# Patient Record
Sex: Male | Born: 1990 | Race: White | Hispanic: No | Marital: Single | Smoking: Current every day smoker
Health system: Southern US, Community
[De-identification: ages and names within clinical notes are randomized; demographics above are authoritative.]

## PROBLEM LIST (undated history)

## (undated) DIAGNOSIS — L503 Dermatographic urticaria: Secondary | ICD-10-CM

## (undated) HISTORY — PX: KNEE SURGERY: SHX244

---
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
## 2009-03-11 ENCOUNTER — Emergency Department: Payer: Self-pay | Admitting: Unknown Physician Specialty

## 2011-04-22 ENCOUNTER — Emergency Department: Payer: Self-pay | Admitting: Emergency Medicine

## 2013-11-21 ENCOUNTER — Emergency Department: Payer: Self-pay | Admitting: Emergency Medicine

## 2014-04-28 ENCOUNTER — Emergency Department: Payer: Self-pay | Admitting: Emergency Medicine

## 2014-05-11 ENCOUNTER — Emergency Department: Payer: Self-pay | Admitting: Emergency Medicine

## 2014-10-21 ENCOUNTER — Emergency Department: Payer: Self-pay | Admitting: Emergency Medicine

## 2016-04-22 ENCOUNTER — Emergency Department: Payer: Self-pay

## 2016-04-22 ENCOUNTER — Encounter: Payer: Self-pay | Admitting: Emergency Medicine

## 2016-04-22 ENCOUNTER — Emergency Department
Admission: EM | Admit: 2016-04-22 | Discharge: 2016-04-22 | Disposition: A | Discharge status: Home / Self Care | Payer: Self-pay | Attending: Emergency Medicine | Admitting: Emergency Medicine

## 2016-04-22 DIAGNOSIS — R21 Rash and other nonspecific skin eruption: Secondary | ICD-10-CM | POA: Insufficient documentation

## 2016-04-22 DIAGNOSIS — R079 Chest pain, unspecified: Secondary | ICD-10-CM | POA: Insufficient documentation

## 2016-04-22 DIAGNOSIS — Z5321 Procedure and treatment not carried out due to patient leaving prior to being seen by health care provider: Secondary | ICD-10-CM | POA: Insufficient documentation

## 2016-04-22 DIAGNOSIS — F17210 Nicotine dependence, cigarettes, uncomplicated: Secondary | ICD-10-CM | POA: Insufficient documentation

## 2016-04-22 LAB — BASIC METABOLIC PANEL
ANION GAP: 5 (ref 5–15)
BUN: 19 mg/dL (ref 6–20)
CALCIUM: 9.2 mg/dL (ref 8.9–10.3)
CO2: 27 mmol/L (ref 22–32)
Chloride: 108 mmol/L (ref 101–111)
Creatinine, Ser: 0.93 mg/dL (ref 0.61–1.24)
GLUCOSE: 108 mg/dL — AB (ref 65–99)
POTASSIUM: 3.8 mmol/L (ref 3.5–5.1)
Sodium: 140 mmol/L (ref 135–145)

## 2016-04-22 LAB — CBC
HEMATOCRIT: 41 % (ref 40.0–52.0)
Hemoglobin: 14.1 g/dL (ref 13.0–18.0)
MCH: 31.2 pg (ref 26.0–34.0)
MCHC: 34.5 g/dL (ref 32.0–36.0)
MCV: 90.4 fL (ref 80.0–100.0)
PLATELETS: 227 10*3/uL (ref 150–440)
RBC: 4.53 MIL/uL (ref 4.40–5.90)
RDW: 12.8 % (ref 11.5–14.5)
WBC: 8.3 10*3/uL (ref 3.8–10.6)

## 2016-04-22 LAB — TROPONIN I: Troponin I: <0.03 ng/mL (ref <0.03)

## 2016-04-22 NOTE — ED Notes (Signed)
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

## 2016-04-22 NOTE — ED Triage Notes (Addendum)
Pt to triage via w/c, mask in place; pt reports onset itchy rash at 3am "whole body was hot" with no known cause; st has been seen for same before with no dx; st rash has improved since arrival; pt reports upper CP now with no accomp symptoms

## 2016-06-02 ENCOUNTER — Encounter: Payer: Self-pay | Admitting: *Deleted

## 2016-06-02 ENCOUNTER — Emergency Department
Admission: EM | Admit: 2016-06-02 | Discharge: 2016-06-03 | Disposition: A | Discharge status: Home / Self Care | Payer: Self-pay | Attending: Student in an Organized Health Care Education/Training Program | Admitting: Student in an Organized Health Care Education/Training Program

## 2016-06-02 DIAGNOSIS — F17210 Nicotine dependence, cigarettes, uncomplicated: Secondary | ICD-10-CM | POA: Insufficient documentation

## 2016-06-02 DIAGNOSIS — R21 Rash and other nonspecific skin eruption: Secondary | ICD-10-CM

## 2016-06-02 DIAGNOSIS — R112 Nausea with vomiting, unspecified: Secondary | ICD-10-CM | POA: Insufficient documentation

## 2016-06-02 DIAGNOSIS — L299 Pruritus, unspecified: Secondary | ICD-10-CM | POA: Insufficient documentation

## 2016-06-02 MED ORDER — PREDNISONE 10 MG (21) PO TBPK: 10.0000 mg | ORAL_TABLET | Freq: Every day | ORAL | 0 refills | Status: DC

## 2016-06-02 MED ORDER — FAMOTIDINE 20 MG PO TABS: 20.0000 mg | ORAL_TABLET | Freq: Two times a day (BID) | ORAL | 1 refills | Status: DC

## 2016-06-02 MED ORDER — DEXAMETHASONE SODIUM PHOSPHATE 10 MG/ML IJ SOLN
10.0000 mg | Freq: Once | INTRAMUSCULAR | Status: AC
  Administered 2016-06-02: 10 mg via INTRAMUSCULAR
  Filled 2016-06-02: qty 1

## 2016-06-02 NOTE — ED Provider Notes (Signed)
[facility] Emergency Department Provider Note  ____________________________________________  Time seen: Approximately 10:50 PM  I have reviewed the triage vital signs and the nursing notes.   HISTORY  Chief Complaint Rash   HPI Keith Rodgers is a 25 y.o. male presenting with diffuse hives since approximately 5:00 PM this evening. Patient has experienced 1 episode of nausea and vomiting since the hives became visible. Patient states that he experienced similar episodes approximately 2-3 times per year. However, he is unable to pinpoint a source for the reaction. Patient denies facial swelling. He denies shortness of breath, chest tightness, chest pain and facial fullness. Patient describes hives as "itchy". He has taken Benadryl, which has improved his symptoms. He denies aggravating factors. Patient works in [employer]eron[employer].  No past medical history on file.  There are no active problems to display for this patient.   No past surgical history on file.  Prior to Admission medications   Medication Sig Start Date End Date Taking? Authorizing Provider  famotidine (PEPCID) 20 MG tablet Take 1 tablet (20 mg total) by mouth 2 (two) times daily. 06/02/16 07/02/16  Orvil FeilJaclyn M Esteban Kobashigawa, PA-C  predniSONE (STERAPRED UNI-PAK 21 TAB) 10 MG (21) TBPK tablet Take 1 tablet (10 mg total) by mouth daily. 06/02/16   Orvil FeilJaclyn M Jakara Blatter, PA-C    Allergies Patient has no known allergies.  No family history on file.  Social History Social History  Substance Use Topics  . Smoking status: Current Every Day Smoker    Packs/day: 0.50    Types: Cigarettes  . Smokeless tobacco: Never Used  . Alcohol use No    Review of Systems  Constitutional: No fever/chills Respiratory: No shortness of breath or chest tightness. Skin: Has hives. Neurological: Negative for headaches, focal weakness or numbness. ____________________________________________   PHYSICAL EXAM:  VITAL SIGNS: ED  Triage Vitals  Enc Vitals Group     BP 06/02/16 2235 125/67     Pulse Rate 06/02/16 2235 (!) 58     Resp 06/02/16 2235 20     Temp 06/02/16 2235 97.8 F (36.6 C)     Temp Source 06/02/16 2235 Oral     SpO2 06/02/16 2235 99 %     Weight 06/02/16 2236 130 lb (59 kg)     Height 06/02/16 2236 6\' 1"  (1.854 m)     Head Circumference --      Peak Flow --      Pain Score --      Pain Loc --      Pain Edu? --      Excl. in GC? --      Constitutional: Alert and oriented. Well appearing and in no acute distress. Eyes: Conjunctivae are normal. EOMI. Nose: No congestion/rhinnorhea. Mouth/Throat: Mucous membranes are moist.   Neck: No stridor. Lymphatic: No cervical lymphadenopathy. Cardiovascular: Good peripheral circulation. Respiratory: Normal respiratory effort.  No retractions. Lungs clear to auscultation bilaterally. Musculoskeletal: FROM throughout. Neurologic:  Normal speech and language. No gross focal neurologic deficits are appreciated. Skin:  Patient has diffuse maculopapular regions ranging from 0.5 millimeter to 3 mm across his abdomen and legs. No angioedema was appreciated during physical exam. Vital signs were stable during physical exam.  ____________________________________________   LABS (all labs ordered are listed, but only abnormal results are displayed)  Labs Reviewed - No data to display ____________________________________________     PROCEDURES  Procedure(s) performed: None  ____________________________________________   INITIAL IMPRESSION / ASSESSMENT AND PLAN / ED COURSE  Clinical  Course     Pertinent labs & imaging results that were available during my care of the patient were reviewed by me and considered in my medical decision making (see chart for details).  Assessment: Allergy: Patient has diffuse maculopapular rash that is pruritic.   Plan: Patient's vital signs remained stable throughout ED course. An injection of Decadron was provided.  Patient was prescribed oral prednisone and famotidine to be used in conjunction with antihistamine should reaction occur again. All patient questions were answered. Patient was advised to return to the emergency department should symptoms acutely worsen.   ____________________________________________   FINAL CLINICAL IMPRESSION(S) / ED DIAGNOSES  Final diagnoses:  Rash    New Prescriptions   FAMOTIDINE (PEPCID) 20 MG TABLET    Take 1 tablet (20 mg total) by mouth 2 (two) times daily.   PREDNISONE (STERAPRED UNI-PAK 21 TAB) 10 MG (21) TBPK TABLET    Take 1 tablet (10 mg total) by mouth daily.    Note:  This document was prepared using Dragon voice recognition software and may include unintentional dictation errors.    Orvil FeilJaclyn M Shanetra Blumenstock, PA-C 06/02/16 2339    Willy EddyPatrick Robinson, MD 06/02/16 [phone]

## 2016-06-02 NOTE — ED Triage Notes (Signed)
Pt has red itching rash all over body.  No resp distress.  Pt took benadryl with some relief.   Sx began 3 hours ago.   Pt alert.

## 2016-06-02 NOTE — ED Notes (Signed)
Reviewed d/c instructions, follow-up care, prescriptions with pt. Pt verbalized understanding.  

## 2016-06-02 NOTE — ED Notes (Signed)
Called pharmacy to request medication 

## 2016-10-07 ENCOUNTER — Encounter: Payer: Self-pay | Admitting: Emergency Medicine

## 2016-10-07 ENCOUNTER — Emergency Department: Payer: Medicaid Other

## 2016-10-07 ENCOUNTER — Emergency Department
Admission: EM | Admit: 2016-10-07 | Discharge: 2016-10-07 | Disposition: A | Discharge status: Home / Self Care | Payer: Medicaid Other | Attending: Emergency Medicine | Admitting: Emergency Medicine

## 2016-10-07 DIAGNOSIS — J189 Pneumonia, unspecified organism: Secondary | ICD-10-CM

## 2016-10-07 DIAGNOSIS — F17210 Nicotine dependence, cigarettes, uncomplicated: Secondary | ICD-10-CM | POA: Insufficient documentation

## 2016-10-07 DIAGNOSIS — J181 Lobar pneumonia, unspecified organism: Secondary | ICD-10-CM | POA: Insufficient documentation

## 2016-10-07 MED ORDER — IBUPROFEN 800 MG PO TABS: 800.0000 mg | ORAL_TABLET | Freq: Three times a day (TID) | ORAL | 0 refills | Status: DC | PRN

## 2016-10-07 MED ORDER — PSEUDOEPH-BROMPHEN-DM 30-2-10 MG/5ML PO SYRP: 5.0000 mL | ORAL_SOLUTION | Freq: Four times a day (QID) | ORAL | 0 refills | Status: DC | PRN

## 2016-10-07 MED ORDER — AZITHROMYCIN 250 MG PO TABS: ORAL_TABLET | ORAL | 0 refills | Status: DC

## 2016-10-07 NOTE — ED Triage Notes (Signed)
Reports cough off and on for the past month with sinus pain, subjective fevers, c/o congestion. Mild throat irritation as well. Using OTC cough medication. No ibuprofen or Tylenol in a day or 2 per pt.

## 2016-10-07 NOTE — Discharge Instructions (Signed)
Avoid close contact for 3-5 days.

## 2016-10-07 NOTE — ED Notes (Addendum)
See triage note  States he developed cough and runny nose about 1 month.  But developed sinus pressure with slight sore throat for the couple of days  Unsure of fever but has had "hot" flashes

## 2016-10-07 NOTE — ED Provider Notes (Signed)
[facility] Emergency Department Provider Note   ____________________________________________   None    (approximate)  I have reviewed the triage vital signs and the nursing notes.   HISTORY  Chief Complaint Nasal Congestion; Cough; and Fever    HPI Keith Rodgers is a 26 y.o. male patient complain of nasal congestion, intermittent productive cough, checked a fever and sore throat for 1 month. Patient state he has used over-the-counter cough medication couple weeks ago. No palliative measures in the last 2 days. Patient denies nausea vomiting diarrhea. Patient does complain of fatigue and coughing which increases at night.Patient states does not know how high his fever was stated he has "hot flashes".   History reviewed. No pertinent past medical history.  There are no active problems to display for this patient.   History reviewed. No pertinent surgical history.  Prior to Admission medications   Medication Sig Start Date End Date Taking? Authorizing Provider  azithromycin (ZITHROMAX Z-PAK) 250 MG tablet Two Tablets today; then one daily daily until finish. 10/07/16   Joni Reiningonald K Kc Summerson, PA-C  brompheniramine-pseudoephedrine-DM 30-2-10 MG/5ML syrup Take 5 mLs by mouth 4 (four) times daily as needed. 10/07/16   Joni Reiningonald K Krishav Mamone, PA-C  famotidine (PEPCID) 20 MG tablet Take 1 tablet (20 mg total) by mouth 2 (two) times daily. 06/02/16 07/02/16  Orvil FeilJaclyn M Woods, PA-C  ibuprofen (ADVIL,MOTRIN) 800 MG tablet Take 1 tablet (800 mg total) by mouth every 8 (eight) hours as needed for moderate pain. 10/07/16   Joni Reiningonald K Berda Shelvin, PA-C  predniSONE (STERAPRED UNI-PAK 21 TAB) 10 MG (21) TBPK tablet Take 1 tablet (10 mg total) by mouth daily. 06/02/16   Orvil FeilJaclyn M Woods, PA-C    Allergies Patient has no known allergies.  History reviewed. No pertinent family history.  Social History Social History  Substance Use Topics  . Smoking status: Current Every Day Smoker   Packs/day: 0.50    Types: Cigarettes  . Smokeless tobacco: Never Used  . Alcohol use Yes    Review of Systems Constitutional: FeverAnd fatigue  Eyes: No visual changes. ZOX:[id number]:Nasal congestion, intermittent rhinorrhea, and sore throat. Cardiovascular: Denies chest pain. Respiratory: Denies shortness of breath. Productive cough Gastrointestinal: No abdominal pain.  No nausea, no vomiting.  No diarrhea.  No constipation. Genitourinary: Negative for dysuria. Musculoskeletal: Negative for back pain. Skin: Negative for rash. Neurological: Negative for headaches, focal weakness or numbness.    ____________________________________________   PHYSICAL EXAM:  VITAL SIGNS: ED Triage Vitals [10/07/16 0826]  Enc Vitals Group     BP 124/77     Pulse Rate 76     Resp 20     Temp 98.8 F (37.1 C)     Temp Source Oral     SpO2 96 %     Weight 130 lb (59 kg)     Height 6\' 1"  (1.854 m)     Head Circumference      Peak Flow      Pain Score      Pain Loc      Pain Edu?      Excl. in GC?     Constitutional: Alert and oriented. Well appearing and in no acute distress. Eyes: Conjunctivae are normal. PERRL. EOMI. Head: Atraumatic. Nose: No congestion/rhinnorhea. Mouth/Throat: Mucous membranes are moist.  Oropharynx non-erythematous. Neck: No stridor.  No cervical spine tenderness to palpation. Hematological/Lymphatic/Immunilogical: No cervical lymphadenopathy. Cardiovascular: Normal rate, regular rhythm. Grossly normal heart sounds.  Good peripheral circulation. Respiratory: Normal respiratory effort.  No retractions. Lungs CTAB. Gastrointestinal: Soft and nontender. No distention. No abdominal bruits. No CVA tenderness. Musculoskeletal: No lower extremity tenderness nor edema.  No joint effusions. Neurologic:  Normal speech and language. No gross focal neurologic deficits are appreciated. No gait instability. Skin:  Skin is warm, dry and intact. No rash noted. Psychiatric: Mood and  affect are normal. Speech and behavior are normal.  ____________________________________________   LABS (all labs ordered are listed, but only abnormal results are displayed)  Labs Reviewed - No data to display ____________________________________________  EKG   ____________________________________________  RADIOLOGY  Chest x-ray consistent right lower lobe pneumonia. ____________________________________________   PROCEDURES  Procedure(s) performed: None  Procedures  Critical Care performed: No  ____________________________________________   INITIAL IMPRESSION / ASSESSMENT AND PLAN / ED COURSE  Pertinent labs & imaging results that were available during my care of the patient were reviewed by me and considered in my medical decision making (see chart for details).  Management acquired pneumonia. Patient given discharge care instructions. Patient given a prescription for Zithromax, [place] DM, and ibuprofen.. Patient advised to follow close contact for 3-5 days. Patient given a work no. Patient advised to follow-up with family [facility] if condition does not improve in 3 days. Return by ER if condition worsens.      ____________________________________________   FINAL CLINICAL IMPRESSION(S) / ED DIAGNOSES  Final diagnoses:  Community acquired pneumonia of right lower lobe of lung (HCC)      NEW MEDICATIONS STARTED DURING THIS VISIT:  New Prescriptions   AZITHROMYCIN (ZITHROMAX Z-PAK) 250 MG TABLET    Two Tablets today; then one daily daily until finish.   BROMPHENIRAMINE-PSEUDOEPHEDRINE-DM 30-2-10 MG/5ML SYRUP    Take 5 mLs by mouth 4 (four) times daily as needed.   IBUPROFEN (ADVIL,MOTRIN) 800 MG TABLET    Take 1 tablet (800 mg total) by mouth every 8 (eight) hours as needed for moderate pain.     Note:  This document was prepared using Dragon voice recognition software and may include unintentional dictation errors.    Joni Reining, PA-C 10/07/16  1610    Governor Rooks, MD 10/07/16 [phone]

## 2016-10-13 ENCOUNTER — Encounter: Payer: Self-pay | Admitting: Emergency Medicine

## 2016-10-13 ENCOUNTER — Emergency Department
Admission: EM | Admit: 2016-10-13 | Discharge: 2016-10-13 | Disposition: A | Discharge status: Home / Self Care | Payer: Medicaid Other | Attending: Emergency Medicine | Admitting: Emergency Medicine

## 2016-10-13 ENCOUNTER — Emergency Department: Payer: Medicaid Other

## 2016-10-13 DIAGNOSIS — F17210 Nicotine dependence, cigarettes, uncomplicated: Secondary | ICD-10-CM | POA: Insufficient documentation

## 2016-10-13 DIAGNOSIS — Z79899 Other long term (current) drug therapy: Secondary | ICD-10-CM | POA: Insufficient documentation

## 2016-10-13 DIAGNOSIS — Z791 Long term (current) use of non-steroidal anti-inflammatories (NSAID): Secondary | ICD-10-CM | POA: Insufficient documentation

## 2016-10-13 DIAGNOSIS — J181 Lobar pneumonia, unspecified organism: Secondary | ICD-10-CM | POA: Insufficient documentation

## 2016-10-13 DIAGNOSIS — J189 Pneumonia, unspecified organism: Secondary | ICD-10-CM

## 2016-10-13 DIAGNOSIS — R079 Chest pain, unspecified: Secondary | ICD-10-CM

## 2016-10-13 LAB — BASIC METABOLIC PANEL
ANION GAP: 6 (ref 5–15)
BUN: 15 mg/dL (ref 6–20)
CHLORIDE: 105 mmol/L (ref 101–111)
CO2: 28 mmol/L (ref 22–32)
Calcium: 9.3 mg/dL (ref 8.9–10.3)
Creatinine, Ser: 0.79 mg/dL (ref 0.61–1.24)
GFR calc Af Amer: >60 mL/min (ref >60)
GFR calc non Af Amer: >60 mL/min (ref >60)
GLUCOSE: 87 mg/dL (ref 65–99)
Potassium: 4.1 mmol/L (ref 3.5–5.1)
Sodium: 139 mmol/L (ref 135–145)

## 2016-10-13 LAB — CBC
HEMATOCRIT: 41.7 % (ref 40.0–52.0)
HEMOGLOBIN: 14.2 g/dL (ref 13.0–18.0)
MCH: 30.8 pg (ref 26.0–34.0)
MCHC: 34.2 g/dL (ref 32.0–36.0)
MCV: 90.1 fL (ref 80.0–100.0)
Platelets: 284 10*3/uL (ref 150–440)
RBC: 4.63 MIL/uL (ref 4.40–5.90)
RDW: 12.7 % (ref 11.5–14.5)
WBC: 6.7 10*3/uL (ref 3.8–10.6)

## 2016-10-13 LAB — TROPONIN I: Troponin I: <0.03 ng/mL (ref <0.03)

## 2016-10-13 MED ORDER — GUAIFENESIN 200 MG PO TABS: 400.0000 mg | ORAL_TABLET | ORAL | Status: DC | PRN

## 2016-10-13 MED ORDER — HYDROCOD POLST-CPM POLST ER 10-8 MG/5ML PO SUER
5.0000 mL | Freq: Once | ORAL | Status: AC
  Administered 2016-10-13: 5 mL via ORAL
  Filled 2016-10-13: qty 5

## 2016-10-13 MED ORDER — GUAIFENESIN-CODEINE 100-10 MG/5ML PO SOLN: 5.0000 mL | Freq: Four times a day (QID) | ORAL | 0 refills | Status: DC | PRN

## 2016-10-13 MED ORDER — GUAIFENESIN ER 600 MG PO TB12
600.0000 mg | ORAL_TABLET | Freq: Two times a day (BID) | ORAL | Status: DC | PRN
  Administered 2016-10-13: 600 mg via ORAL
  Filled 2016-10-13: qty 1

## 2016-10-13 NOTE — ED Notes (Signed)
Pt to ed with c/o chest pain x several days. Reports sob and cough and congestion x 3 weeks.  Pt left lung clear to auscultation, right lung with mild crackles in base.  Pt skin warm and dry.  Pt alert and oriented and in no resp distress.

## 2016-10-13 NOTE — ED Provider Notes (Signed)
[facility] Emergency Department Provider Note  Time seen: 2:02 PM  I have reviewed the triage vital signs and the nursing notes.   HISTORY  Chief Complaint Chest Pain    HPI Keith Rodgers is a 26 y.o. male with no past medical history who presents to the emergency department with continued cough and chest discomfort. According to the patient record review the patient was diagnosed with pneumonia one week ago. Patient states he finished his antibiotics one or 2 days ago but continues to have a cough and some right-sided chest discomfort so he came to the emergency department for evaluation. Patient denies any trouble breathing. He continues to have a cough with yellow sputum production. Denies any fever.  History reviewed. No pertinent past medical history.  There are no active problems to display for this patient.   History reviewed. No pertinent surgical history.  Prior to Admission medications   Medication Sig Start Date End Date Taking? Authorizing Provider  azithromycin (ZITHROMAX Z-PAK) 250 MG tablet Two Tablets today; then one daily daily until finish. 10/07/16   Joni Reining, PA-C  azithromycin (ZITHROMAX) 250 MG tablet 2 tablets today; then one tablets daily until finished. 10/07/16   Joni Reining, PA-C  brompheniramine-pseudoephedrine-DM 30-2-10 MG/5ML syrup Take 5 mLs by mouth 4 (four) times daily as needed. 10/07/16   Joni Reining, PA-C  brompheniramine-pseudoephedrine-DM 30-2-10 MG/5ML syrup Take 5 mLs by mouth 4 (four) times daily as needed. 10/07/16   Joni Reining, PA-C  famotidine (PEPCID) 20 MG tablet Take 1 tablet (20 mg total) by mouth 2 (two) times daily. 06/02/16 07/02/16  Orvil Feil, PA-C  ibuprofen (ADVIL,MOTRIN) 800 MG tablet Take 1 tablet (800 mg total) by mouth every 8 (eight) hours as needed for moderate pain. 10/07/16   Joni Reining, PA-C  ibuprofen (ADVIL,MOTRIN) 800 MG tablet Take 1 tablet (800 mg total) by mouth every 8  (eight) hours as needed for moderate pain. 10/07/16   Joni Reining, PA-C  predniSONE (STERAPRED UNI-PAK 21 TAB) 10 MG (21) TBPK tablet Take 1 tablet (10 mg total) by mouth daily. 06/02/16   Orvil Feil, PA-C    No Known Allergies  No family history on file.  Social History Social History  Substance Use Topics  . Smoking status: Current Every Day Smoker    Packs/day: 0.25    Types: Cigarettes  . Smokeless tobacco: Never Used  . Alcohol use Yes     Comment: weekends    Review of Systems Constitutional: Negative for fever. Cardiovascular: Continues to have right chest discomfort Respiratory: Negative for shortness of breath. Positive for cough or sputum production Gastrointestinal: Negative for abdominal pain Neurological: Negative for headache 10-point ROS otherwise negative.  ____________________________________________   PHYSICAL EXAM:  VITAL SIGNS: ED Triage Vitals  Enc Vitals Group     BP 10/13/16 1107 (!) 121/57     Pulse Rate 10/13/16 1107 (!) 58     Resp 10/13/16 1107 16     Temp 10/13/16 1107 97.8 F (36.6 C)     Temp src --      SpO2 10/13/16 1107 100 %     Weight 10/13/16 1106 135 lb (61.2 kg)     Height 10/13/16 1106 6\' 1"  (1.854 m)     Head Circumference --      Peak Flow --      Pain Score 10/13/16 1107 6     Pain Loc --  Pain Edu? --      Excl. in GC? --     Constitutional: Alert and oriented. Well appearing and in no distress. Eyes: Normal exam ENT   Head: Normocephalic and atraumatic.   Mouth/Throat: Mucous membranes are moist. Cardiovascular: Normal rate, regular rhythm. No murmur Respiratory: Normal respiratory effort without tachypnea nor retractions. Breath sounds are largely clear. Mild rhonchi which clears with coughing. Gastrointestinal: Soft and nontender. No distention.   Musculoskeletal: Nontender with normal range of motion in all extremities. Neurologic:  Normal speech and language. No gross focal neurologic  deficits Skin:  Skin is warm, dry and intact.  Psychiatric: Mood and affect are normal.   ____________________________________________    EKG  EKG reviewed and interpreted by myself shows sinus bradycardia at 57 bpm, narrow QRS, normal axis, normal intervals, no ST changes. Normal EKG.  ____________________________________________    RADIOLOGY  IMPRESSION: Minimal residual right lower lobe airspace disease with near complete interval resolution.  ____________________________________________   INITIAL IMPRESSION / ASSESSMENT AND PLAN / ED COURSE  Pertinent labs & imaging results that were available during my care of the patient were reviewed by me and considered in my medical decision making (see chart for details).  Patient presents to the emergency department with continued cough and congestion and some right chest discomfort. Patient was diagnosed with pneumonia 10/07/16. Given antibiotics which she completed one or 2 days ago per patient. Patient's chest x-ray shows near complete resolution of pneumonia. Patient's vital signs are within normal limits. White blood cell count is normal. I suspect the patients Ona is largely resolving that this will take several more days for the inflammatory response to settle down. With a normal white blood cell count normal vitals including oxygen saturation we will have the patient follow-up with a primary care doctor next week for recheck/reevaluation. We will prescribe guaifenesin and codeine for symptom relief. I discussed return precautions for any worsening chest pain, increased trouble breathing, or fever.  ____________________________________________   FINAL CLINICAL IMPRESSION(S) / ED DIAGNOSES  Resolving pneumonia    Minna AntisKevin Yeni Jiggetts, MD 10/13/16 [phone]

## 2016-10-13 NOTE — Discharge Instructions (Signed)
You have been seen in the emergency department for continued chest discomfort cough and congestion. Ear exam and workup is most consistent with resolving pneumonia. Please take your medication as prescribed, as needed. Please follow-up with a primary care doctor next week for recheck such reevaluation. Return to the emergency department for any worsening chest pain, any increased trouble breathing or fever 101 or higher.

## 2016-10-13 NOTE — ED Triage Notes (Signed)
Patient presents to ED via POV from home with c/o CP x 1 week. Patient was dx with pneumonia 1 week ago. Patient states pain is constant but worse with movement. Patient is ambulatory to triage. A&O x4.

## 2016-11-28 ENCOUNTER — Emergency Department
Admission: EM | Admit: 2016-11-28 | Discharge: 2016-11-28 | Disposition: A | Discharge status: Home / Self Care | Payer: Medicaid Other | Attending: Emergency Medicine | Admitting: Emergency Medicine

## 2016-11-28 DIAGNOSIS — W57XXXA Bitten or stung by nonvenomous insect and other nonvenomous arthropods, initial encounter: Secondary | ICD-10-CM

## 2016-11-28 DIAGNOSIS — S30862A Insect bite (nonvenomous) of penis, initial encounter: Secondary | ICD-10-CM | POA: Insufficient documentation

## 2016-11-28 DIAGNOSIS — Y999 Unspecified external cause status: Secondary | ICD-10-CM | POA: Insufficient documentation

## 2016-11-28 DIAGNOSIS — F17210 Nicotine dependence, cigarettes, uncomplicated: Secondary | ICD-10-CM | POA: Insufficient documentation

## 2016-11-28 DIAGNOSIS — Y929 Unspecified place or not applicable: Secondary | ICD-10-CM | POA: Insufficient documentation

## 2016-11-28 DIAGNOSIS — Y939 Activity, unspecified: Secondary | ICD-10-CM | POA: Insufficient documentation

## 2016-11-28 MED ORDER — HYDROCORTISONE 2.5 % EX OINT: TOPICAL_OINTMENT | Freq: Two times a day (BID) | CUTANEOUS | 0 refills | Status: DC

## 2016-11-28 NOTE — ED Notes (Signed)
Pt reports he pulled a tick off the penis head today and now pt has swelling and states part of the tick is till attached.  No diff urinating. Pt alert.

## 2016-11-28 NOTE — ED Triage Notes (Signed)
Pt found a tick on his penis today, states has tried to take it off but head is embedded and unable to remove it. Pt states now has redness and irritation around the site, states has been trying to remove it.

## 2016-11-28 NOTE — ED Provider Notes (Signed)
[facility] Emergency Department Provider Note  ____________________________________________  Time seen: Approximately 9:50 PM  I have reviewed the triage vital signs and the nursing notes.   HISTORY  Chief Complaint Penis Injury   HPI Keith Rodgers is a 26 y.o. male who presents to the emergency department for evaluation of penile swelling and pain due to tick bite. He has attempted to remove the tick, but the head of the tick is still there. He denies fever or rash. No fatigue or malaise.  No past medical history on file.  There are no active problems to display for this patient.   No past surgical history on file.  Prior to Admission medications   Medication Sig Start Date End Date Taking? Authorizing Provider  azithromycin (ZITHROMAX Z-PAK) 250 MG tablet Two Tablets today; then one daily daily until finish. 10/07/16   Joni ReiningSmith, Ronald K, PA-C  azithromycin (ZITHROMAX) 250 MG tablet 2 tablets today; then one tablets daily until finished. 10/07/16   Joni ReiningSmith, Ronald K, PA-C  brompheniramine-pseudoephedrine-DM 30-2-10 MG/5ML syrup Take 5 mLs by mouth 4 (four) times daily as needed. 10/07/16   Joni ReiningSmith, Ronald K, PA-C  brompheniramine-pseudoephedrine-DM 30-2-10 MG/5ML syrup Take 5 mLs by mouth 4 (four) times daily as needed. 10/07/16   Joni ReiningSmith, Ronald K, PA-C  famotidine (PEPCID) 20 MG tablet Take 1 tablet (20 mg total) by mouth 2 (two) times daily. 06/02/16 07/02/16  Orvil FeilWoods, Jaclyn M, PA-C  guaiFENesin-codeine 100-10 MG/5ML syrup Take 5 mLs by mouth every 6 (six) hours as needed for cough. 10/13/16   Minna AntisPaduchowski, Kevin, MD  hydrocortisone 2.5 % ointment Apply topically 2 (two) times daily. 11/28/16   Ethyl Vila B, FNP  ibuprofen (ADVIL,MOTRIN) 800 MG tablet Take 1 tablet (800 mg total) by mouth every 8 (eight) hours as needed for moderate pain. 10/07/16   Joni ReiningSmith, Ronald K, PA-C  ibuprofen (ADVIL,MOTRIN) 800 MG tablet Take 1 tablet (800 mg total) by mouth every 8 (eight)  hours as needed for moderate pain. 10/07/16   Joni ReiningSmith, Ronald K, PA-C  predniSONE (STERAPRED UNI-PAK 21 TAB) 10 MG (21) TBPK tablet Take 1 tablet (10 mg total) by mouth daily. 06/02/16   Orvil FeilWoods, Jaclyn M, PA-C    Allergies Patient has no known allergies.  No family history on file.  Social History Social History  Substance Use Topics  . Smoking status: Current Every Day Smoker    Packs/day: 0.25    Types: Cigarettes  . Smokeless tobacco: Never Used  . Alcohol use Yes     Comment: weekends    Review of Systems  Constitutional: Negative for fever/chills Respiratory: Negataive for shortness of breath. Musculoskeletal: Negative for pain. Skin: Positive for foreign body in skin of penis. Neurological: Negative for headaches, focal weakness or numbness. ____________________________________________   PHYSICAL EXAM:  VITAL SIGNS: ED Triage Vitals  Enc Vitals Group     BP 11/28/16 2021 126/74     Pulse Rate 11/28/16 2021 66     Resp 11/28/16 2021 16     Temp 11/28/16 2021 98.4 F (36.9 C)     Temp Source 11/28/16 2021 Oral     SpO2 11/28/16 2021 100 %     Weight 11/28/16 2022 135 lb (61.2 kg)     Height 11/28/16 2022 6\' 1"  (1.854 m)     Head Circumference --      Peak Flow --      Pain Score 11/28/16 2023 3     Pain Loc --  Pain Edu? --      Excl. in GC? --      Constitutional: Alert and oriented. Well appearing and in no acute distress. Eyes: Conjunctivae are normal. EOMI. Nose: No congestion/rhinnorhea. Mouth/Throat: Mucous membranes are moist.   Neck: No stridor. Lymphatic: No cervical lymphadenopathy. Cardiovascular: Good peripheral circulation. Respiratory: Normal respiratory effort.  No retractions. Musculoskeletal: FROM throughout. Neurologic:  Normal speech and language. No gross focal neurologic deficits are appreciated. Skin:  Small foreign body in shaft of penis consistent with patient report of partial tick removal. Localize inflammation of the shaft  of the penis that does not extend to the glans.  ____________________________________________   LABS (all labs ordered are listed, but only abnormal results are displayed)  Labs Reviewed - No data to display ____________________________________________  EKG   ____________________________________________  RADIOLOGY  Not indicated. ____________________________________________   PROCEDURES  Procedure(s) performed: Foreign body which was likely the head of a small tick, removed from shaft of penis with fine point splinter forceps. Area cleansed with betadine afterward.  ____________________________________________   INITIAL IMPRESSION / ASSESSMENT AND PLAN / ED COURSE  26 year old male presenting to the emergency department for evaluation and removal of tick. Hydrocortisone ointment prescribed for inflammation and pruritis. He is to follow up with the primary care provider of his choice for symptoms of concern or return to the emergency department.  Pertinent labs & imaging results that were available during my care of the patient were reviewed by me and considered in my medical decision making (see chart for details).   ____________________________________________   FINAL CLINICAL IMPRESSION(S) / ED DIAGNOSES  Final diagnoses:  Tick bite, initial encounter    Discharge Medication List as of 11/28/2016 10:07 PM    START taking these medications   Details  hydrocortisone 2.5 % ointment Apply topically 2 (two) times daily., Starting Mon 11/28/2016, Print         If controlled substance prescribed during this visit, 12 month history viewed on the [facility] prior to issuing an initial prescription for Schedule II or III opiod.   Note:  This document was prepared using Dragon voice recognition software and may include unintentional dictation errors.    Chinita Pester, FNP 11/28/16 2341    Kem Boroughs B, FNP 11/28/16 2344    Merrily Brittle, MD 11/29/16  [phone]

## 2018-02-05 ENCOUNTER — Encounter: Payer: Self-pay | Admitting: Emergency Medicine

## 2018-02-05 ENCOUNTER — Emergency Department: Payer: Self-pay

## 2018-02-05 ENCOUNTER — Emergency Department
Admission: EM | Admit: 2018-02-05 | Discharge: 2018-02-05 | Disposition: A | Discharge status: Home / Self Care | Payer: Self-pay | Attending: Student in an Organized Health Care Education/Training Program | Admitting: Student in an Organized Health Care Education/Training Program

## 2018-02-05 DIAGNOSIS — R200 Anesthesia of skin: Secondary | ICD-10-CM | POA: Insufficient documentation

## 2018-02-05 DIAGNOSIS — R202 Paresthesia of skin: Secondary | ICD-10-CM

## 2018-02-05 DIAGNOSIS — L299 Pruritus, unspecified: Secondary | ICD-10-CM | POA: Insufficient documentation

## 2018-02-05 DIAGNOSIS — F17210 Nicotine dependence, cigarettes, uncomplicated: Secondary | ICD-10-CM | POA: Insufficient documentation

## 2018-02-05 DIAGNOSIS — R079 Chest pain, unspecified: Secondary | ICD-10-CM | POA: Insufficient documentation

## 2018-02-05 LAB — BASIC METABOLIC PANEL
ANION GAP: 9 (ref 5–15)
BUN: 21 mg/dL — ABNORMAL HIGH (ref 6–20)
CALCIUM: 9 mg/dL (ref 8.9–10.3)
CO2: 23 mmol/L (ref 22–32)
Chloride: 108 mmol/L (ref 98–111)
Creatinine, Ser: 1.05 mg/dL (ref 0.61–1.24)
GLUCOSE: 99 mg/dL (ref 70–99)
POTASSIUM: 4 mmol/L (ref 3.5–5.1)
Sodium: 140 mmol/L (ref 135–145)

## 2018-02-05 LAB — CBC
HEMATOCRIT: 43.6 % (ref 40.0–52.0)
HEMOGLOBIN: 15.2 g/dL (ref 13.0–18.0)
MCH: 32 pg (ref 26.0–34.0)
MCHC: 34.8 g/dL (ref 32.0–36.0)
MCV: 91.8 fL (ref 80.0–100.0)
Platelets: 248 10*3/uL (ref 150–440)
RBC: 4.75 MIL/uL (ref 4.40–5.90)
RDW: 13 % (ref 11.5–14.5)
WBC: 7.9 10*3/uL (ref 3.8–10.6)

## 2018-02-05 LAB — TROPONIN I: Troponin I: <0.03 ng/mL (ref <0.03)

## 2018-02-05 MED ORDER — RANITIDINE HCL 150 MG PO TABS: 150.0000 mg | ORAL_TABLET | Freq: Two times a day (BID) | ORAL | 0 refills | Status: DC

## 2018-02-05 MED ORDER — PREDNISONE 20 MG PO TABS: 40.0000 mg | ORAL_TABLET | Freq: Every day | ORAL | 0 refills | Status: AC

## 2018-02-05 NOTE — ED Triage Notes (Signed)
Patient states that he woke up about 15 minutes ago with chest pain. He also states that his left arm felt numb for about 10 minutes. Patient states that he is also itching all over. Patient states that he has been seen here in the past for similar symptoms.

## 2018-02-05 NOTE — ED Provider Notes (Signed)
[facility] Emergency Department Provider Note    First MD Initiated Contact with Patient 02/05/18 [phone]     (approximate)  I have reviewed the triage vital signs and the nursing notes.   HISTORY  Chief Complaint Chest Pain    HPI Keith Rodgers is a 27 y.o. male presents with chief complaint of midsternal chest pain left arm numbness and weakness swelling in his throat and diffuse itchy rash that the patient woke up with early this morning.  States that has happened multiple times in the past.  Says is been 1 year since last happened.  States he was sleeping on the right side with a pillow.  Woke up and felt like he was having weakness in his left arm so he started getting very anxious started itching everywhere did have some shortness of breath with this.  Symptoms resolved prior to arrival to the ER.    History reviewed. No pertinent past medical history. No family history on file. Past Surgical History:  Procedure Laterality Date  . KNEE SURGERY     There are no active problems to display for this patient.     Prior to Admission medications   Medication Sig Start Date End Date Taking? Authorizing Provider  azithromycin (ZITHROMAX Z-PAK) 250 MG tablet Two Tablets today; then one daily daily until finish. 10/07/16   Joni Reining, PA-C  azithromycin (ZITHROMAX) 250 MG tablet 2 tablets today; then one tablets daily until finished. 10/07/16   Joni Reining, PA-C  brompheniramine-pseudoephedrine-DM 30-2-10 MG/5ML syrup Take 5 mLs by mouth 4 (four) times daily as needed. 10/07/16   Joni Reining, PA-C  brompheniramine-pseudoephedrine-DM 30-2-10 MG/5ML syrup Take 5 mLs by mouth 4 (four) times daily as needed. 10/07/16   Joni Reining, PA-C  famotidine (PEPCID) 20 MG tablet Take 1 tablet (20 mg total) by mouth 2 (two) times daily. 06/02/16 07/02/16  Orvil Feil, PA-C  guaiFENesin-codeine 100-10 MG/5ML syrup Take 5 mLs by mouth every 6 (six) hours as  needed for cough. 10/13/16   Minna Antis, MD  hydrocortisone 2.5 % ointment Apply topically 2 (two) times daily. 11/28/16   Triplett, Cari B, FNP  ibuprofen (ADVIL,MOTRIN) 800 MG tablet Take 1 tablet (800 mg total) by mouth every 8 (eight) hours as needed for moderate pain. 10/07/16   Joni Reining, PA-C  ibuprofen (ADVIL,MOTRIN) 800 MG tablet Take 1 tablet (800 mg total) by mouth every 8 (eight) hours as needed for moderate pain. 10/07/16   Joni Reining, PA-C  predniSONE (DELTASONE) 20 MG tablet Take 2 tablets (40 mg total) by mouth daily for 4 days. 02/05/18 02/09/18  Willy Eddy, MD  predniSONE (STERAPRED UNI-PAK 21 TAB) 10 MG (21) TBPK tablet Take 1 tablet (10 mg total) by mouth daily. 06/02/16   Orvil Feil, PA-C  ranitidine (ZANTAC) 150 MG tablet Take 1 tablet (150 mg total) by mouth 2 (two) times daily. 02/05/18 02/05/19  Willy Eddy, MD    Allergies Patient has no known allergies.    Social History Social History   Tobacco Use  . Smoking status: Current Every Day Smoker    Packs/day: 0.25    Types: Cigarettes  . Smokeless tobacco: Current User  Substance Use Topics  . Alcohol use: Yes  . Drug use: No    Review of Systems Patient denies headaches, rhinorrhea, blurry vision, numbness, shortness of breath, chest pain, edema, cough, abdominal pain, nausea, vomiting, diarrhea, dysuria, fevers, rashes or hallucinations unless  otherwise stated above in HPI. ____________________________________________   PHYSICAL EXAM:  VITAL SIGNS: Vitals:   02/05/18 0517  BP: 126/86  Pulse: 73  Resp: 18  Temp: 97.6 F (36.4 C)  SpO2: 99%    Constitutional: Alert and oriented.  Eyes: Conjunctivae are normal.  Head: Atraumatic. Nose: No congestion/rhinnorhea. Mouth/Throat: Mucous membranes are moist.   Neck: No stridor. Painless ROM.  Cardiovascular: Normal rate, regular rhythm. Grossly normal heart sounds.  Good peripheral circulation. Respiratory: Normal  respiratory effort.  No retractions. Lungs CTAB. Gastrointestinal: Soft and nontender. No distention. No abdominal bruits. No CVA tenderness. Genitourinary:  Musculoskeletal: No lower extremity tenderness nor edema.  No joint effusions. Neurologic:  Normal speech and language. No gross focal neurologic deficits are appreciated. No facial droop Skin:  Skin is warm, dry and intact. No rash noted. Psychiatric: Mood and affect are normal. Speech and behavior are normal.  ____________________________________________   LABS (all labs ordered are listed, but only abnormal results are displayed)  Results for orders placed or performed during the hospital encounter of 02/05/18 (from the past 24 hour(s))  Basic metabolic panel     Status: Abnormal   Collection Time: 02/05/18  5:21 AM  Result Value Ref Range   Sodium 140 135 - 145 mmol/L   Potassium 4.0 3.5 - 5.1 mmol/L   Chloride 108 98 - 111 mmol/L   CO2 23 22 - 32 mmol/L   Glucose, Bld 99 70 - 99 mg/dL   BUN 21 (H) 6 - 20 mg/dL   Creatinine, Ser 1.191.05 0.61 - 1.24 mg/dL   Calcium 9.0 8.9 - 14.710.3 mg/dL   GFR calc non Af Amer >60 >60 mL/min   GFR calc Af Amer >60 >60 mL/min   Anion gap 9 5 - 15  CBC     Status: None   Collection Time: 02/05/18  5:21 AM  Result Value Ref Range   WBC 7.9 3.8 - 10.6 K/uL   RBC 4.75 4.40 - 5.90 MIL/uL   Hemoglobin 15.2 13.0 - 18.0 g/dL   HCT 82.943.6 56.240.0 - 13.052.0 %   MCV 91.8 80.0 - 100.0 fL   MCH 32.0 26.0 - 34.0 pg   MCHC 34.8 32.0 - 36.0 g/dL   RDW 86.513.0 78.411.5 - 69.614.5 %   Platelets 248 150 - 440 K/uL  Troponin I     Status: None   Collection Time: 02/05/18  5:21 AM  Result Value Ref Range   Troponin I <0.03 <0.03 ng/mL   ____________________________________________  EKG My review and personal interpretation at Time: 5:13   Indication: chest pain  Rate: 60  Rhythm: sinus Axis: normal Other: normal intervals, no stemi, concave upwards st segment with j point elevation consistent with  BER ____________________________________________  RADIOLOGY  I personally reviewed all radiographic images ordered to evaluate for the above acute complaints and reviewed radiology reports and findings.  These findings were personally discussed with the patient.  Please see medical record for radiology report.  ____________________________________________   PROCEDURES  Procedure(s) performed:  Procedures    Critical Care performed: no ____________________________________________   INITIAL IMPRESSION / ASSESSMENT AND PLAN / ED COURSE  Pertinent labs & imaging results that were available during my care of the patient were reviewed by me and considered in my medical decision making (see chart for details).   DDX: ACS, pericarditis, esophagitis, boerhaaves, pe, dissection, pna, bronchitis, costochondritis   Keith Rodgers is a 27 y.o. who presents to the ED with symptoms as described above.  EKG shows no evidence of acute ischemia.  Patient low risk by heart score.  No evidence of anaphylactic or allergic reaction.  Patient's not hypoxia.  Low risk by Wells criteria and is PERC negative.  Neuro exam is nonfocal.  Patient does endorse history of heartburn and states that he ate [employer] last night.  States he has been belching more frequently.  Stable and appropriate for outpatient follow up.  Have discussed with the patient and available family all diagnostics and treatments performed thus far and all questions were answered to the best of my ability. The patient demonstrates understanding and agreement with plan.       As part of my medical decision making, I reviewed the following data within the electronic MEDICAL RECORD NUMBER Nursing notes reviewed and incorporated, Labs reviewed, notes from prior ED visits and [place] Controlled Substance Database   ____________________________________________   FINAL CLINICAL IMPRESSION(S) / ED DIAGNOSES  Final diagnoses:  Chest pain,  unspecified type  Itching  Paresthesia      NEW MEDICATIONS STARTED DURING THIS VISIT:  New Prescriptions   PREDNISONE (DELTASONE) 20 MG TABLET    Take 2 tablets (40 mg total) by mouth daily for 4 days.   RANITIDINE (ZANTAC) 150 MG TABLET    Take 1 tablet (150 mg total) by mouth 2 (two) times daily.     Note:  This document was prepared using Dragon voice recognition software and may include unintentional dictation errors.    Willy Eddy, MD 02/05/18 [phone]

## 2018-02-05 NOTE — ED Notes (Signed)
Pt reports he woke up with centralized chest pain, itching all over and hi left arm "felt like a noodle". Pt reports everything is better now except for the itching. Pt constantly scratching his arms, trunk and legs during assessment.

## 2018-02-05 NOTE — ED Notes (Signed)

## 2018-05-12 IMAGING — CR DG CHEST 2V
1 series · 2 of 2 positions shown · non-contrast
Comparison: Chest radiograph May 11, 2014

CLINICAL DATA: Total body rash beginning at 3 a.m. Upper chest
pain.

EXAM:
CHEST  2 VIEW

[Series 1: dg chest 2 view · 0.14mm/px · 2 of 2 slices shown]
[im 1/2]
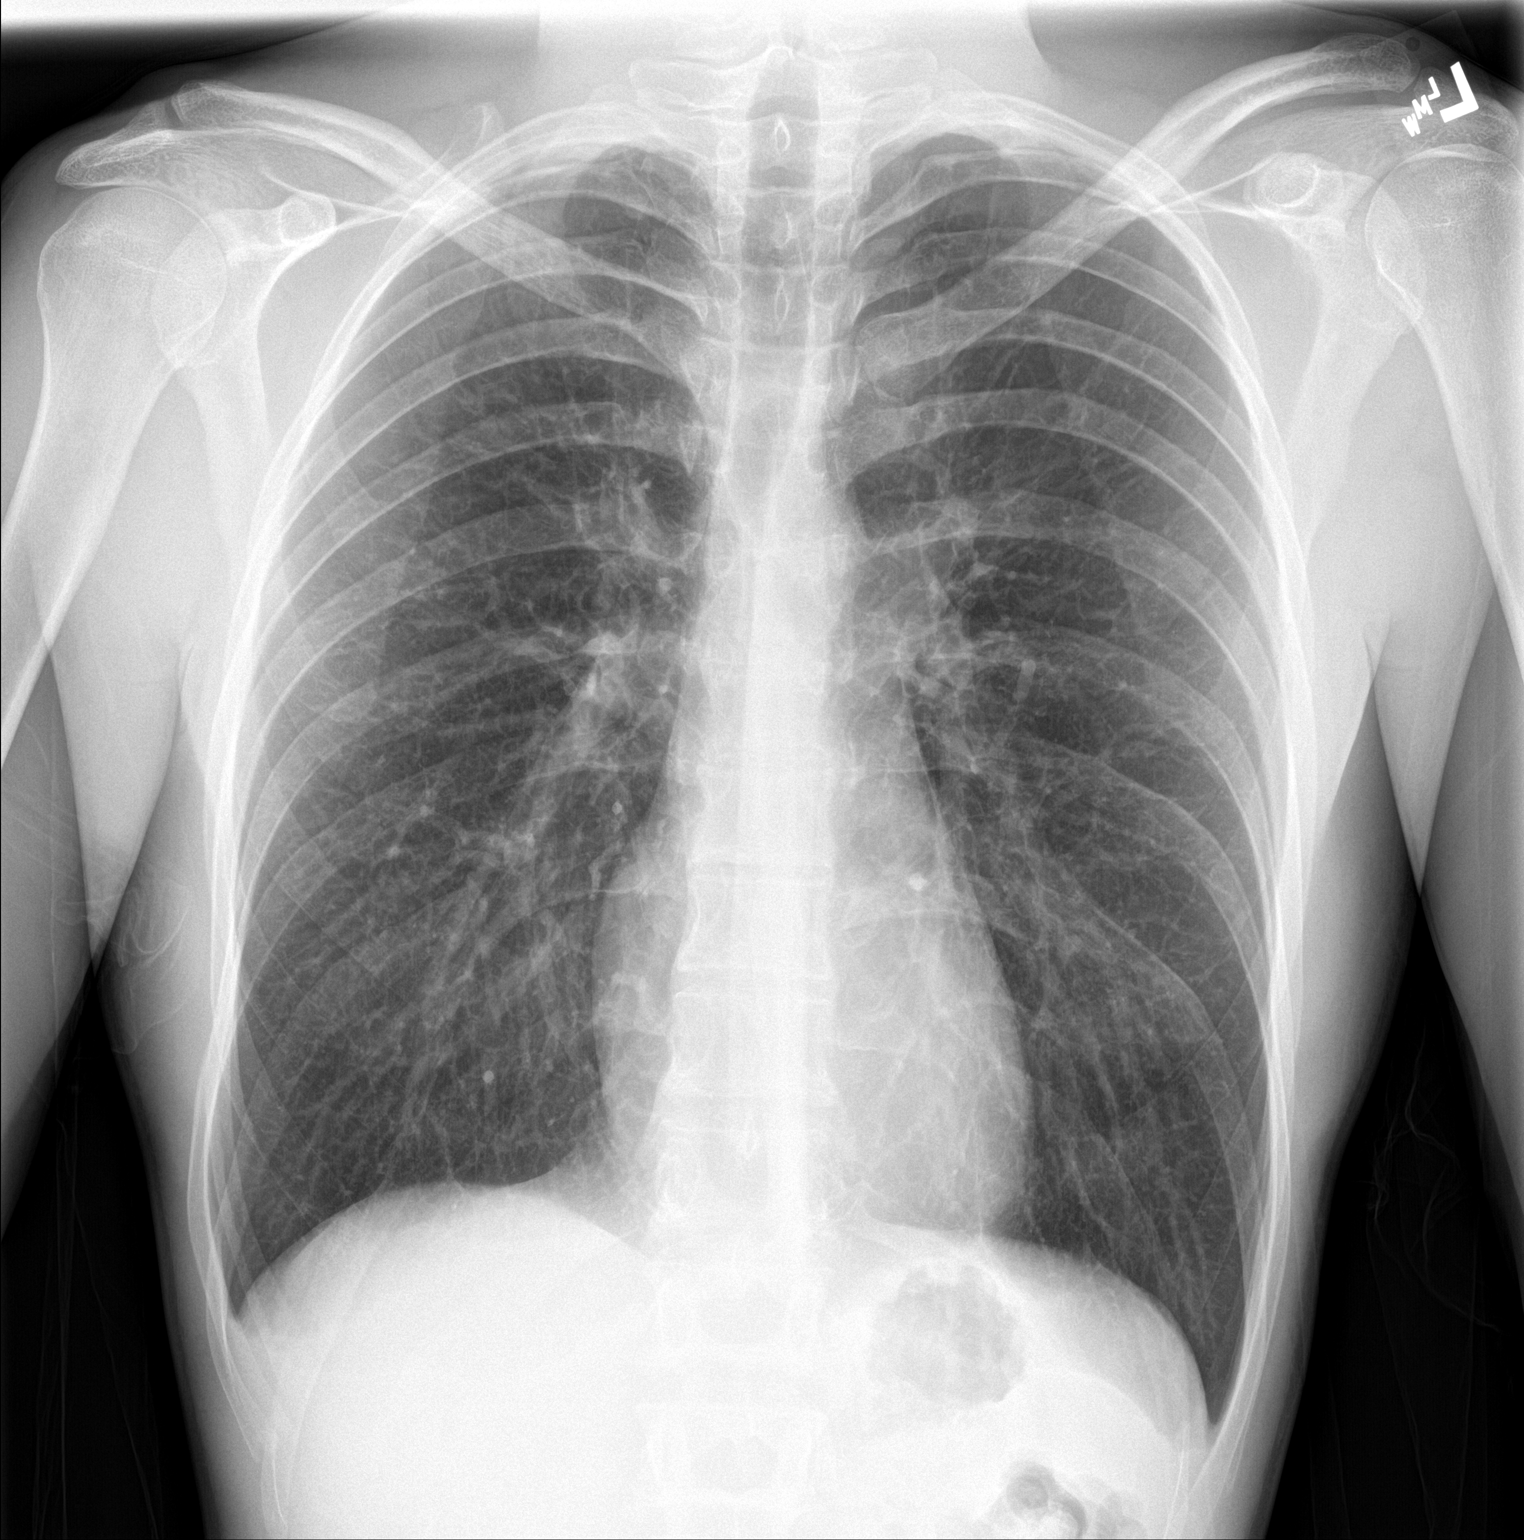
[im 2/2]
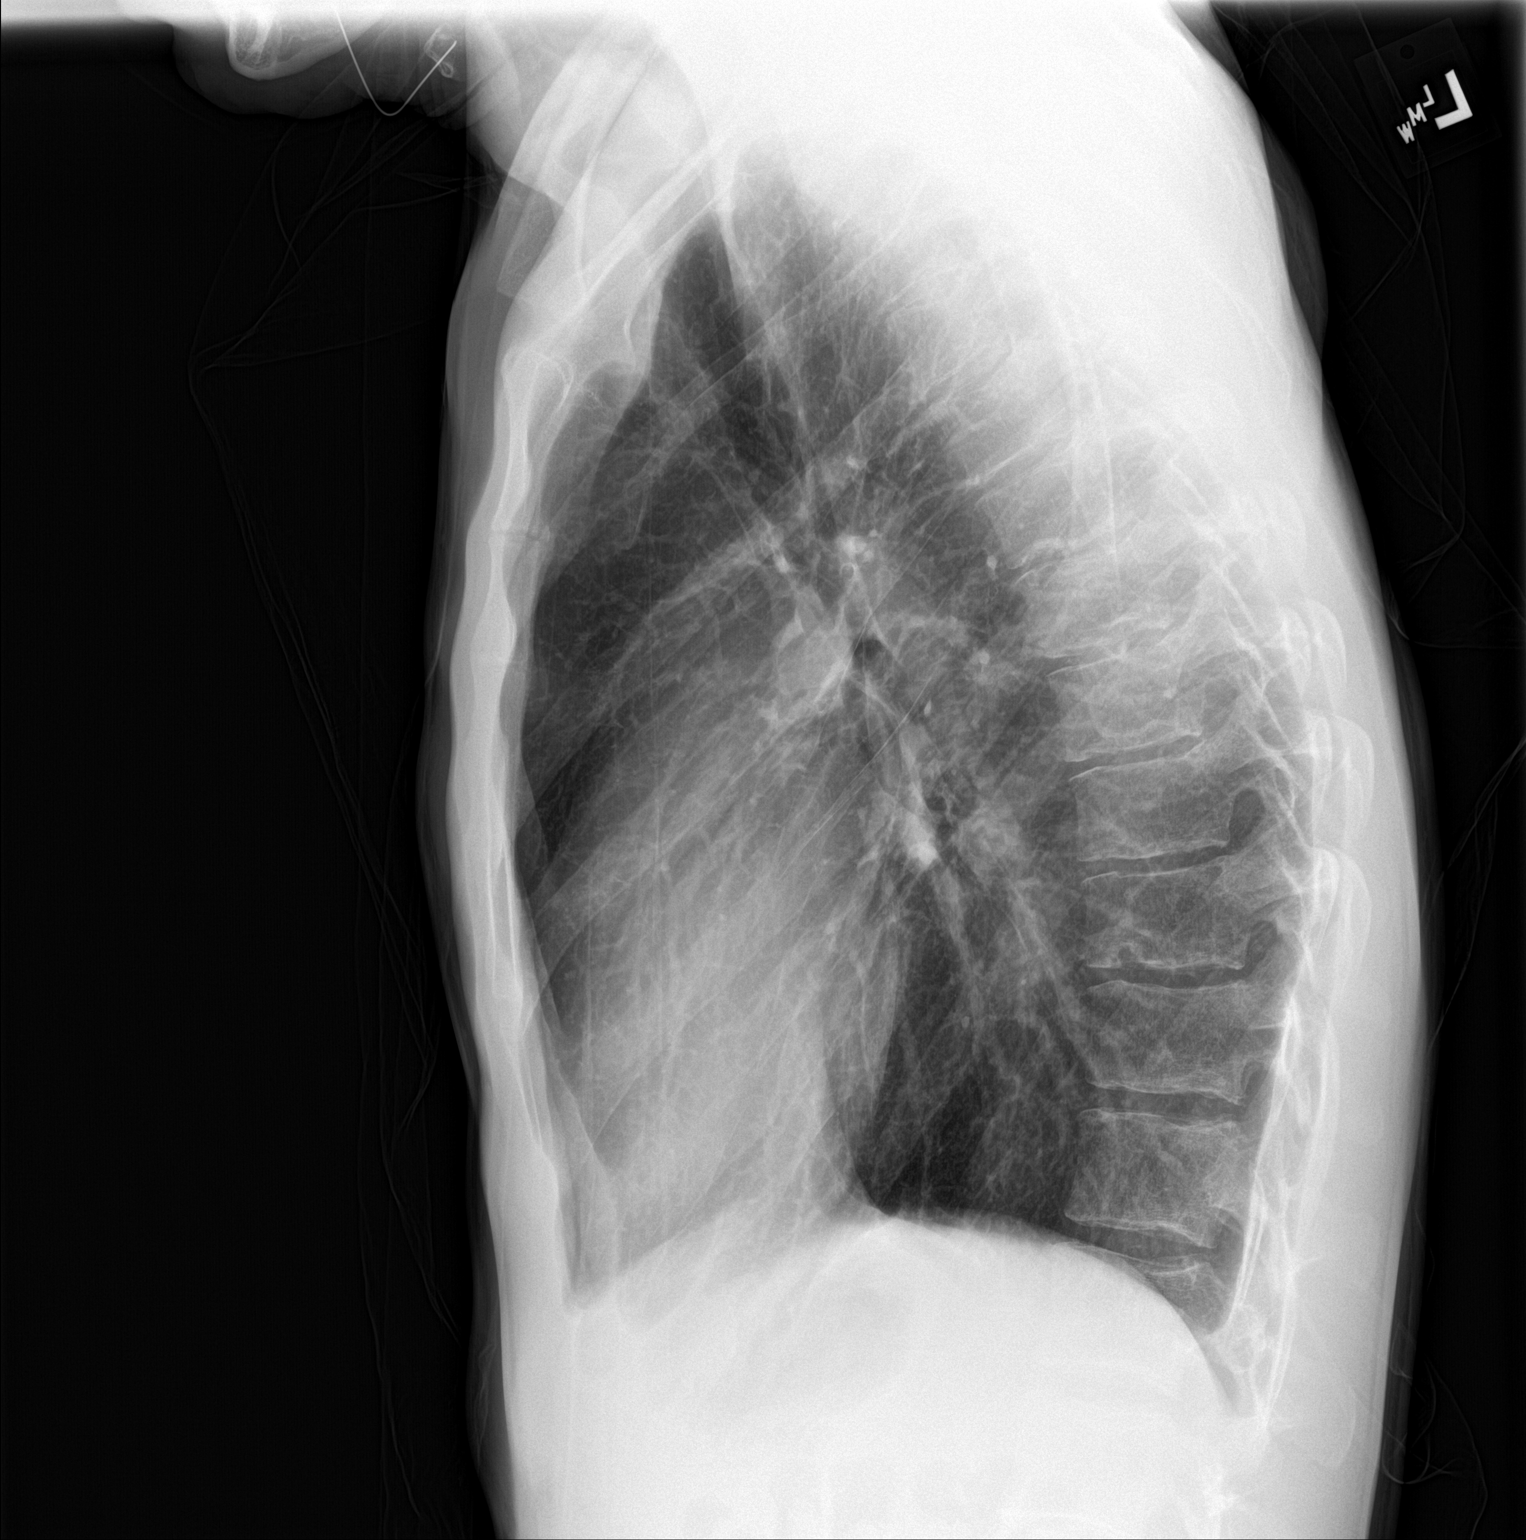

[2 of 2 positions shown; findings below may reference images not displayed]

FINDINGS: Cardiomediastinal silhouette is normal. No pleural effusions or
focal consolidations. Trachea projects midline and there is no
pneumothorax. Soft tissue planes and included osseous structures are
non-suspicious. Scattered Schmorl's nodes.
IMPRESSION: Normal chest radiograph.

## 2018-05-21 ENCOUNTER — Emergency Department
Admission: EM | Admit: 2018-05-21 | Discharge: 2018-05-21 | Disposition: A | Discharge status: Home / Self Care | Payer: Medicaid Other | Attending: Emergency Medicine | Admitting: Emergency Medicine

## 2018-05-21 DIAGNOSIS — X501XXA Overexertion from prolonged static or awkward postures, initial encounter: Secondary | ICD-10-CM | POA: Insufficient documentation

## 2018-05-21 DIAGNOSIS — Y929 Unspecified place or not applicable: Secondary | ICD-10-CM | POA: Insufficient documentation

## 2018-05-21 DIAGNOSIS — Y939 Activity, unspecified: Secondary | ICD-10-CM | POA: Insufficient documentation

## 2018-05-21 DIAGNOSIS — K029 Dental caries, unspecified: Secondary | ICD-10-CM | POA: Insufficient documentation

## 2018-05-21 DIAGNOSIS — K0889 Other specified disorders of teeth and supporting structures: Secondary | ICD-10-CM

## 2018-05-21 DIAGNOSIS — F17210 Nicotine dependence, cigarettes, uncomplicated: Secondary | ICD-10-CM | POA: Insufficient documentation

## 2018-05-21 DIAGNOSIS — Y999 Unspecified external cause status: Secondary | ICD-10-CM | POA: Insufficient documentation

## 2018-05-21 DIAGNOSIS — T148XXA Other injury of unspecified body region, initial encounter: Secondary | ICD-10-CM | POA: Insufficient documentation

## 2018-05-21 MED ORDER — PENICILLIN V POTASSIUM 250 MG PO TABS: 250.0000 mg | ORAL_TABLET | Freq: Four times a day (QID) | ORAL | 0 refills | Status: DC

## 2018-05-21 MED ORDER — IBUPROFEN 400 MG PO TABS
ORAL_TABLET | ORAL | Status: AC
  Filled 2018-05-21: qty 1

## 2018-05-21 MED ORDER — CYCLOBENZAPRINE HCL 10 MG PO TABS: 10.0000 mg | ORAL_TABLET | Freq: Three times a day (TID) | ORAL | 0 refills | Status: DC | PRN

## 2018-05-21 MED ORDER — PENICILLIN V POTASSIUM 250 MG PO TABS
500.0000 mg | ORAL_TABLET | Freq: Once | ORAL | Status: AC
  Administered 2018-05-21: 500 mg via ORAL
  Filled 2018-05-21: qty 2

## 2018-05-21 MED ORDER — IBUPROFEN 400 MG PO TABS
400.0000 mg | ORAL_TABLET | Freq: Once | ORAL | Status: AC | PRN
  Administered 2018-05-21: 400 mg via ORAL

## 2018-05-21 MED ORDER — IBUPROFEN 800 MG PO TABS: 800.0000 mg | ORAL_TABLET | Freq: Three times a day (TID) | ORAL | 0 refills | Status: DC | PRN

## 2018-05-21 MED ORDER — IBUPROFEN 800 MG PO TABS
800.0000 mg | ORAL_TABLET | Freq: Once | ORAL | Status: AC
  Administered 2018-05-21: 800 mg via ORAL
  Filled 2018-05-21: qty 1

## 2018-05-21 NOTE — ED Notes (Signed)
Patient is complaining of right sided jaw pain x 2 weeks. No obvious swelling.  States pain is also in mouth and throat.  Patient is alert and oriented x 4.  No fever.  No obvious distress at this time.

## 2018-05-21 NOTE — ED Notes (Signed)
Patient ambulatory in lobby in no acute distress.

## 2018-05-21 NOTE — ED Triage Notes (Addendum)
Patient c/o right jaw pain radiating to neck and through mouth X 2-3 weeks. No obvious swelling seen on assessment. Patient c/o nasal congestion for same time frame.

## 2018-05-21 NOTE — ED Provider Notes (Signed)
[facility] Emergency Department Provider Note       Time seen: ----------------------------------------- 7:23 AM on 05/21/2018 -----------------------------------------   I have reviewed the triage vital signs and the nursing notes.  HISTORY   Chief Complaint Jaw Pain    HPI Keith Rodgers is a 27 y.o. male with no significant past medical history who presents to the ED for jaw pain radiating to his neck and mouth for the past 2 to 3 weeks.  He also complains of nasal congestion and some pain with range of motion of his neck.  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  History reviewed. No pertinent past medical history.  There are no active problems to display for this patient.   Past Surgical History:  Procedure Laterality Date  . KNEE SURGERY      Allergies Patient has no known allergies.  Social History Social History   Tobacco Use  . Smoking status: Current Every Day Smoker    Packs/day: 0.25    Types: Cigarettes  . Smokeless tobacco: Current User  Substance Use Topics  . Alcohol use: Yes  . Drug use: No   Review of Systems Constitutional: Negative for fever. ENT: Positive for jaw pain, congestion Cardiovascular: Negative for chest pain. Respiratory: Negative for shortness of breath. Musculoskeletal: Positive for neck and jaw pain Skin: Negative for rash. Neurological: Negative for headaches, focal weakness or numbness.  All systems negative/normal/unremarkable except as stated in the HPI  ____________________________________________   PHYSICAL EXAM:  VITAL SIGNS: ED Triage Vitals [05/21/18 0134]  Enc Vitals Group     BP 136/81     Pulse Rate 66     Resp 18     Temp 98.6 F (37 C)     Temp Source Oral     SpO2 99 %     Weight 160 lb 15 oz (73 kg)     Height 6' 1.5" (1.867 m)     Head Circumference      Peak Flow      Pain Score      Pain Loc      Pain Edu?      Excl. in  GC?    Constitutional: Alert and oriented. Well appearing and in no distress. ENT   Head: Normocephalic and atraumatic.   Nose: No congestion/rhinnorhea.   Mouth/Throat: Mucous membranes are moist.  Dental caries are noted, particularly in the posterior molars on the upper and lower jaw on the right, no obvious jaw swelling is appreciated   Neck: No stridor. Cardiovascular: Normal rate, regular rhythm. No murmurs, rubs, or gallops. Respiratory: Normal respiratory effort without tachypnea nor retractions. Breath sounds are clear and equal bilaterally. No wheezes/rales/rhonchi. Neurologic:  Normal speech and language. Skin:  Skin is warm, dry and intact. No rash noted. ____________________________________________  ED COURSE:  As part of my medical decision making, I reviewed the following data within the electronic MEDICAL RECORD NUMBER History obtained from family if available, nursing notes, old chart and ekg, as well as notes from prior ED visits. Patient presented for jaw pain, patient be treated with Pen-Vee K and Motrin.  I will also prescribe Flexeril.   Procedures ____________________________________________  DIFFERENTIAL DIAGNOSIS   Dental caries, dental abscess, muscle strain, spasm  FINAL ASSESSMENT AND PLAN  Dental caries   Plan: The patient had presented for toothache and jaw pain.  Patient is being started on penicillin and Motrin.  He is  cleared for outpatient follow-up.   Ulice Dash, MD   Note: This note was generated in part or whole with voice recognition software. Voice recognition is usually quite accurate but there are transcription errors that can and very often do occur. I apologize for any typographical errors that were not detected and corrected.     Emily Filbert, MD 05/21/18 [phone]

## 2019-10-28 ENCOUNTER — Other Ambulatory Visit: Payer: Self-pay

## 2019-10-28 ENCOUNTER — Emergency Department
Admission: EM | Admit: 2019-10-28 | Discharge: 2019-10-28 | Disposition: A | Discharge status: Home / Self Care | Payer: Medicaid Other | Attending: Emergency Medicine | Admitting: Emergency Medicine

## 2019-10-28 DIAGNOSIS — X58XXXA Exposure to other specified factors, initial encounter: Secondary | ICD-10-CM | POA: Insufficient documentation

## 2019-10-28 DIAGNOSIS — Y929 Unspecified place or not applicable: Secondary | ICD-10-CM | POA: Insufficient documentation

## 2019-10-28 DIAGNOSIS — S0502XA Injury of conjunctiva and corneal abrasion without foreign body, left eye, initial encounter: Secondary | ICD-10-CM

## 2019-10-28 DIAGNOSIS — F17210 Nicotine dependence, cigarettes, uncomplicated: Secondary | ICD-10-CM | POA: Insufficient documentation

## 2019-10-28 DIAGNOSIS — Y9389 Activity, other specified: Secondary | ICD-10-CM | POA: Insufficient documentation

## 2019-10-28 DIAGNOSIS — Y999 Unspecified external cause status: Secondary | ICD-10-CM | POA: Insufficient documentation

## 2019-10-28 MED ORDER — ERYTHROMYCIN 5 MG/GM OP OINT: 1.0000 "application " | TOPICAL_OINTMENT | Freq: Four times a day (QID) | OPHTHALMIC | 0 refills | Status: AC

## 2019-10-28 MED ORDER — ERYTHROMYCIN 5 MG/GM OP OINT
TOPICAL_OINTMENT | Freq: Once | OPHTHALMIC | Status: AC
  Administered 2019-10-28: 1 via OPHTHALMIC
  Filled 2019-10-28: qty 1

## 2019-10-28 MED ORDER — KETOROLAC TROMETHAMINE 0.5 % OP SOLN: 1.0000 [drp] | Freq: Four times a day (QID) | OPHTHALMIC | 0 refills | Status: DC

## 2019-10-28 MED ORDER — FLUORESCEIN SODIUM 1 MG OP STRP
1.0000 | ORAL_STRIP | Freq: Once | OPHTHALMIC | Status: AC
  Administered 2019-10-28: 1 via OPHTHALMIC
  Filled 2019-10-28: qty 1

## 2019-10-28 MED ORDER — TETRACAINE HCL 0.5 % OP SOLN
2.0000 [drp] | Freq: Once | OPHTHALMIC | Status: AC
  Administered 2019-10-28: 2 [drp] via OPHTHALMIC
  Filled 2019-10-28: qty 4

## 2019-10-28 MED ORDER — TRAMADOL HCL 50 MG PO TABS: 50.0000 mg | ORAL_TABLET | Freq: Four times a day (QID) | ORAL | 0 refills | Status: DC | PRN

## 2019-10-28 NOTE — Discharge Instructions (Signed)
Please follow up with ophthalmology if not improving over the next 2 days.  Return to the ER for symptoms that change or worsen if unable to schedule an appointment.

## 2019-10-28 NOTE — ED Provider Notes (Signed)
[facility] Emergency Department Provider Note ____________________________________________  Time seen: Approximately 10:43 PM  I have reviewed the triage vital signs and the nursing notes.   HISTORY  Chief Complaint Eye Injury   HPI Keith Rodgers is a 29 y.o. male presenting to the emergency department for treatment and evaluation of left eye irritation.  He states he was using a circular saw and some dust from the wood went into his eyes.  He has flushed his eye with water without relief.   History reviewed. No pertinent past medical history.  There are no problems to display for this patient.   Past Surgical History:  Procedure Laterality Date  . KNEE SURGERY      Prior to Admission medications   Medication Sig Start Date End Date Taking? Authorizing Provider  azithromycin (ZITHROMAX Z-PAK) 250 MG tablet Two Tablets today; then one daily daily until finish. 10/07/16   Sable Feil, PA-C  azithromycin (ZITHROMAX) 250 MG tablet 2 tablets today; then one tablets daily until finished. 10/07/16   Sable Feil, PA-C  brompheniramine-pseudoephedrine-DM 30-2-10 MG/5ML syrup Take 5 mLs by mouth 4 (four) times daily as needed. 10/07/16   Sable Feil, PA-C  brompheniramine-pseudoephedrine-DM 30-2-10 MG/5ML syrup Take 5 mLs by mouth 4 (four) times daily as needed. 10/07/16   Sable Feil, PA-C  cyclobenzaprine (FLEXERIL) 10 MG tablet Take 1 tablet (10 mg total) by mouth 3 (three) times daily as needed for muscle spasms. 05/21/18   Earleen Newport, MD  erythromycin ophthalmic ointment Place 1 application into the left eye 4 (four) times daily for 7 days. 10/28/19 11/04/19  Arlyss Weathersby, Johnette Abraham B, FNP  famotidine (PEPCID) 20 MG tablet Take 1 tablet (20 mg total) by mouth 2 (two) times daily. 06/02/16 07/02/16  Lannie Fields, PA-C  guaiFENesin-codeine 100-10 MG/5ML syrup Take 5 mLs by mouth every 6 (six) hours as needed for cough. 10/13/16   Harvest Dark, MD   hydrocortisone 2.5 % ointment Apply topically 2 (two) times daily. 11/28/16   Breyonna Nault B, FNP  ibuprofen (ADVIL,MOTRIN) 800 MG tablet Take 1 tablet (800 mg total) by mouth every 8 (eight) hours as needed for moderate pain. 10/07/16   Sable Feil, PA-C  ibuprofen (ADVIL,MOTRIN) 800 MG tablet Take 1 tablet (800 mg total) by mouth every 8 (eight) hours as needed for moderate pain. 10/07/16   Sable Feil, PA-C  ibuprofen (ADVIL,MOTRIN) 800 MG tablet Take 1 tablet (800 mg total) by mouth every 8 (eight) hours as needed. 05/21/18   Earleen Newport, MD  ketorolac (ACULAR) 0.5 % ophthalmic solution Place 1 drop into the left eye 4 (four) times daily. 10/28/19   Shalana Jardin, Johnette Abraham B, FNP  penicillin v potassium (VEETID) 250 MG tablet Take 1 tablet (250 mg total) by mouth 4 (four) times daily. 05/21/18   Earleen Newport, MD  predniSONE (STERAPRED UNI-PAK 21 TAB) 10 MG (21) TBPK tablet Take 1 tablet (10 mg total) by mouth daily. 06/02/16   Lannie Fields, PA-C  ranitidine (ZANTAC) 150 MG tablet Take 1 tablet (150 mg total) by mouth 2 (two) times daily. 02/05/18 02/05/19  Merlyn Lot, MD  traMADol (ULTRAM) 50 MG tablet Take 1 tablet (50 mg total) by mouth every 6 (six) hours as needed. 10/28/19   Victorino Dike, FNP    Allergies Patient has no known allergies.  No family history on file.  Social History Social History   Tobacco Use  . Smoking status: Current  Every Day Smoker    Packs/day: 0.25    Types: Cigarettes  . Smokeless tobacco: Current User  Substance Use Topics  . Alcohol use: Yes  . Drug use: No    Review of Systems   Constitutional: No fever/chills Eyes: Negative for visual changes.  Positive for pain.  Negative for drainage. Musculoskeletal: Negative for pain. Skin: Negative for rash. Neurological: Negative for headaches, focal weakness or numbness. Allergic: Negative for seasonal allergies. ____________________________________________  PHYSICAL  EXAM:  VITAL SIGNS: ED Triage Vitals  Enc Vitals Group     BP 10/28/19 2030 138/85     Pulse Rate 10/28/19 2030 78     Resp 10/28/19 2030 16     Temp 10/28/19 2030 98.4 F (36.9 C)     Temp Source 10/28/19 2030 Oral     SpO2 10/28/19 2030 100 %     Weight 10/28/19 2031 150 lb (68 kg)     Height 10/28/19 2031 6\' 1"  (1.854 m)     Head Circumference --      Peak Flow --      Pain Score 10/28/19 2038 7     Pain Loc --      Pain Edu? --      Excl. in GC? --     Constitutional: Alert and oriented. Well appearing and in no acute distress. Eyes: Visual acuity--see nursing documentation; no globe trauma; Eyelids normal to inspection; Sclera appears anicteric.  Left eyelid was inverted. Conjunctiva appears nonicterus; Cornea with small abrasion at approximate 9 o'clock position.  No retained foreign body Head: Atraumatic. Nose: No congestion/rhinnorhea. Mouth/Throat: Mucous membranes are moist.  Oropharynx non-erythematous. Respiratory: Respirations even and unlabored. Breath sounds clear to auscultation. Musculoskeletal:Normal ROM x 4 extremities. Neurologic:  Normal speech and language. No gross focal neurologic deficits are appreciated. Speech is normal. No gait instability. Skin:  Skin is warm, dry and intact. No rash noted. Psychiatric: Mood and affect are normal. Speech and behavior are normal.  ____________________________________________   LABS (all labs ordered are listed, but only abnormal results are displayed)  Labs Reviewed - No data to display ____________________________________________  EKG  Not indicated ____________________________________________  RADIOLOGY  Not indicated ____________________________________________   PROCEDURES  Procedure(s) performed: None ____________________________________________   INITIAL IMPRESSION / ASSESSMENT AND PLAN / ED COURSE  29 year old male presenting to the emergency department for left eye irritation after  getting some sawdust in his eye earlier.  Exam is reassuring.  No retained foreign body.  He does have a small corneal abrasion on fluorescein stain exam.  He will be treated with erythromycin ointment, Acular, and tramadol.  If not improving over the next 2 days, he was given a referral to see ophthalmology.  If he is unable to see ophthalmology and he continues to have issues, he is to return to the emergency department.  Pertinent labs & imaging results that were available during my care of the patient were reviewed by me and considered in my medical decision making (see chart for details). ____________________________________________   FINAL CLINICAL IMPRESSION(S) / ED DIAGNOSES  Final diagnoses:  Abrasion of left cornea, initial encounter    Note:  This document was prepared using Dragon voice recognition software and may include unintentional dictation errors.    36, FNP 10/28/19 2356    12/28/19, MD 10/29/19 [phone]

## 2019-10-28 NOTE — ED Triage Notes (Signed)
Pt arrives c/o left eye irritation, pt states he was using circular saw and dust from wood went into eyes, pt states since he has flushed his eye multiple times at home and has had no relief. Pts eye is noted to have redness, states he has some blurriness.

## 2019-11-24 ENCOUNTER — Encounter: Payer: Self-pay | Admitting: Emergency Medicine

## 2019-11-24 ENCOUNTER — Emergency Department
Admission: EM | Admit: 2019-11-24 | Discharge: 2019-11-24 | Disposition: A | Discharge status: Home / Self Care | Payer: Medicaid Other | Attending: Emergency Medicine | Admitting: Emergency Medicine

## 2019-11-24 ENCOUNTER — Other Ambulatory Visit: Payer: Self-pay

## 2019-11-24 DIAGNOSIS — F17210 Nicotine dependence, cigarettes, uncomplicated: Secondary | ICD-10-CM | POA: Insufficient documentation

## 2019-11-24 DIAGNOSIS — Z79899 Other long term (current) drug therapy: Secondary | ICD-10-CM | POA: Insufficient documentation

## 2019-11-24 DIAGNOSIS — T7840XA Allergy, unspecified, initial encounter: Secondary | ICD-10-CM | POA: Insufficient documentation

## 2019-11-24 MED ORDER — METHYLPREDNISOLONE SODIUM SUCC 125 MG IJ SOLR
125.0000 mg | Freq: Once | INTRAMUSCULAR | Status: AC
  Administered 2019-11-24: 125 mg via INTRAVENOUS

## 2019-11-24 MED ORDER — EPINEPHRINE 0.3 MG/0.3ML IJ SOAJ: 0.3000 mg | INTRAMUSCULAR | 1 refills | Status: AC | PRN

## 2019-11-24 MED ORDER — DIPHENHYDRAMINE HCL 50 MG/ML IJ SOLN
INTRAMUSCULAR | Status: AC
  Filled 2019-11-24: qty 1

## 2019-11-24 MED ORDER — EPINEPHRINE 0.3 MG/0.3ML IJ SOAJ
INTRAMUSCULAR | Status: AC
  Filled 2019-11-24: qty 0.3

## 2019-11-24 MED ORDER — DIPHENHYDRAMINE HCL 50 MG/ML IJ SOLN
50.0000 mg | Freq: Once | INTRAMUSCULAR | Status: AC
  Administered 2019-11-24: 50 mg via INTRAVENOUS

## 2019-11-24 MED ORDER — METHYLPREDNISOLONE SODIUM SUCC 125 MG IJ SOLR
INTRAMUSCULAR | Status: AC
  Filled 2019-11-24: qty 2

## 2019-11-24 MED ORDER — EPINEPHRINE 0.3 MG/0.3ML IJ SOAJ: 0.3000 mg | Freq: Once | INTRAMUSCULAR | Status: DC

## 2019-11-24 MED ORDER — PREDNISONE 20 MG PO TABS: 40.0000 mg | ORAL_TABLET | Freq: Every day | ORAL | 0 refills | Status: AC

## 2019-11-24 MED ORDER — SODIUM CHLORIDE 0.9 % IV BOLUS
1000.0000 mL | Freq: Once | INTRAVENOUS | Status: AC
  Administered 2019-11-24: 1000 mL via INTRAVENOUS

## 2019-11-24 NOTE — ED Provider Notes (Signed)
[facility] Emergency Department Provider Note   ____________________________________________   I have reviewed the triage vital signs and the nursing notes.   HISTORY  Chief Complaint Allergic Reaction   History limited by: Not Limited   HPI Keith Rodgers is a 29 y.o. male who presents to the emergency department today because of concerns for allergic reaction.  The patient states that he woke up tonight feeling itchy.  The itchiness then spread.  He then found that he had hives all over his body.  Patient denies any known allergies.  Denies any new detergents or soaps.  States that he did have hamburger last night and that he eats hamburger frequently.  However he also states that he did have a tick which had bitten him on his back.  This is happened a couple of weeks ago.  Records reviewed. Per medical record review patient has a history of tick bites in the past.   History reviewed. No pertinent past medical history.  There are no problems to display for this patient.   Past Surgical History:  Procedure Laterality Date  . KNEE SURGERY      Prior to Admission medications   Medication Sig Start Date End Date Taking? Authorizing Provider  azithromycin (ZITHROMAX Z-PAK) 250 MG tablet Two Tablets today; then one daily daily until finish. 10/07/16   Joni Reining, PA-C  azithromycin (ZITHROMAX) 250 MG tablet 2 tablets today; then one tablets daily until finished. 10/07/16   Joni Reining, PA-C  brompheniramine-pseudoephedrine-DM 30-2-10 MG/5ML syrup Take 5 mLs by mouth 4 (four) times daily as needed. 10/07/16   Joni Reining, PA-C  brompheniramine-pseudoephedrine-DM 30-2-10 MG/5ML syrup Take 5 mLs by mouth 4 (four) times daily as needed. 10/07/16   Joni Reining, PA-C  cyclobenzaprine (FLEXERIL) 10 MG tablet Take 1 tablet (10 mg total) by mouth 3 (three) times daily as needed for muscle spasms. 05/21/18   Emily Filbert, MD  famotidine (PEPCID)  20 MG tablet Take 1 tablet (20 mg total) by mouth 2 (two) times daily. 06/02/16 07/02/16  Orvil Feil, PA-C  guaiFENesin-codeine 100-10 MG/5ML syrup Take 5 mLs by mouth every 6 (six) hours as needed for cough. 10/13/16   Minna Antis, MD  hydrocortisone 2.5 % ointment Apply topically 2 (two) times daily. 11/28/16   Triplett, Cari B, FNP  ibuprofen (ADVIL,MOTRIN) 800 MG tablet Take 1 tablet (800 mg total) by mouth every 8 (eight) hours as needed for moderate pain. 10/07/16   Joni Reining, PA-C  ibuprofen (ADVIL,MOTRIN) 800 MG tablet Take 1 tablet (800 mg total) by mouth every 8 (eight) hours as needed for moderate pain. 10/07/16   Joni Reining, PA-C  ibuprofen (ADVIL,MOTRIN) 800 MG tablet Take 1 tablet (800 mg total) by mouth every 8 (eight) hours as needed. 05/21/18   Emily Filbert, MD  ketorolac (ACULAR) 0.5 % ophthalmic solution Place 1 drop into the left eye 4 (four) times daily. 10/28/19   Triplett, Rulon Eisenmenger B, FNP  penicillin v potassium (VEETID) 250 MG tablet Take 1 tablet (250 mg total) by mouth 4 (four) times daily. 05/21/18   Emily Filbert, MD  predniSONE (STERAPRED UNI-PAK 21 TAB) 10 MG (21) TBPK tablet Take 1 tablet (10 mg total) by mouth daily. 06/02/16   Orvil Feil, PA-C  ranitidine (ZANTAC) 150 MG tablet Take 1 tablet (150 mg total) by mouth 2 (two) times daily. 02/05/18 02/05/19  Willy Eddy, MD  traMADol Janean Sark) 50 MG tablet  Take 1 tablet (50 mg total) by mouth every 6 (six) hours as needed. 10/28/19   Victorino Dike, FNP    Allergies Patient has no known allergies.  No family history on file.  Social History Social History   Tobacco Use  . Smoking status: Current Every Day Smoker    Packs/day: 0.25    Types: Cigarettes  . Smokeless tobacco: Current User  Substance Use Topics  . Alcohol use: Yes  . Drug use: No    Review of Systems Constitutional: No fever/chills Eyes: No visual changes. ENT: No sore throat. Cardiovascular: Denies chest  pain. Respiratory: Denies shortness of breath. Gastrointestinal: Positive for nausea and vomiting.  Genitourinary: Negative for dysuria. Musculoskeletal: Negative for back pain. Skin: Positive for hives. Neurological: Negative for headaches, focal weakness or numbness.  ____________________________________________   PHYSICAL EXAM:  VITAL SIGNS: ED Triage Vitals  Enc Vitals Group     BP 11/24/19 0444 129/75     Pulse Rate 11/24/19 0444 (!) 119     Resp 11/24/19 0444 20     Temp --      Temp Source 11/24/19 0444 Oral     SpO2 11/24/19 0444 100 %     Weight 11/24/19 0433 130 lb (59 kg)     Height 11/24/19 0433 6' (1.829 m)     Head Circumference --      Peak Flow --      Pain Score 11/24/19 0432 0   Constitutional: Alert and oriented.  Eyes: Conjunctivae are normal.  ENT      Head: Normocephalic and atraumatic.      Nose: No congestion/rhinnorhea.      Mouth/Throat: Mucous membranes are moist.      Neck: No stridor. Hematological/Lymphatic/Immunilogical: No cervical lymphadenopathy. Cardiovascular: Normal rate, regular rhythm.  No murmurs, rubs, or gallops. Respiratory: Normal respiratory effort without tachypnea nor retractions. Breath sounds are clear and equal bilaterally. No wheezes/rales/rhonchi. Gastrointestinal: Soft and non tender. No rebound. No guarding.  Genitourinary: Deferred Musculoskeletal: Normal range of motion in all extremities. No lower extremity edema. Neurologic:  Normal speech and language. No gross focal neurologic deficits are appreciated.  Skin:  Skin is warm, dry and intact. No rash noted. Psychiatric: Mood and affect are normal. Speech and behavior are normal. Patient exhibits appropriate insight and judgment.  ____________________________________________    LABS (pertinent positives/negatives)  None  ____________________________________________   EKG  None  ____________________________________________     RADIOLOGY  None  ____________________________________________   PROCEDURES  Procedures  ____________________________________________   INITIAL IMPRESSION / ASSESSMENT AND PLAN / ED COURSE  Pertinent labs & imaging results that were available during my care of the patient were reviewed by me and considered in my medical decision making (see chart for details).   Patient presented to the emergency department today because of concerns for hives and itchiness that started last night.  Clinical history is somewhat concerning for alpha gal.  Patient states he did have a tick back a couple of weeks ago.  He does eat hamburger and did have a hamburger last night.  I did discuss this with the patient.  Additionally patient was given steroids and Benadryl here with good relief of the hives and itchiness.  Will plan on discharging with further steroids as well as an EpiPen. ____________________________________________   FINAL CLINICAL IMPRESSION(S) / ED DIAGNOSES  Final diagnoses:  Allergic reaction, initial encounter     Note: This dictation was prepared with Dragon dictation. Any transcriptional errors that result  from this process are unintentional     Phineas Semen, MD 11/24/19 [phone]

## 2019-11-24 NOTE — ED Notes (Signed)
Hives gone, skin color normal. No airway compromise noted.

## 2019-11-24 NOTE — ED Notes (Signed)
Report to ashley, rn

## 2019-11-24 NOTE — Discharge Instructions (Addendum)
Please seek medical attention for any high fevers, chest pain, shortness of breath, change in behavior, persistent vomiting, bloody stool or any other new or concerning symptoms.  

## 2019-11-24 NOTE — ED Notes (Addendum)
Redness is gone at this time. Pt is very anxious, arrived to room anxious. Pt coached with calming technique, attempting to calm self. Girlfriend on way to room. Call bell at right side, pt has cellphone and is talking in complete rapid sentences on cell phone. No airway compromise noted. Not able to get a temp at this time due to pt behavior.

## 2019-11-24 NOTE — ED Triage Notes (Signed)
Patient with complaint of allergic reaction that started about 30 minutes ago. Patient with complaint of hives, diarrhea, vomiting and feeling like his throat is closing.

## 2019-11-24 NOTE — ED Notes (Signed)
Pt and visitor sleeping in bed. No distress noted.

## 2020-11-09 ENCOUNTER — Encounter: Payer: Self-pay | Admitting: Emergency Medicine

## 2020-11-09 ENCOUNTER — Emergency Department: Payer: Medicaid Other

## 2020-11-09 ENCOUNTER — Emergency Department
Admission: EM | Admit: 2020-11-09 | Discharge: 2020-11-09 | Disposition: A | Discharge status: Home / Self Care | Payer: Medicaid Other | Attending: Emergency Medicine | Admitting: Emergency Medicine

## 2020-11-09 ENCOUNTER — Other Ambulatory Visit: Payer: Self-pay

## 2020-11-09 DIAGNOSIS — S40012A Contusion of left shoulder, initial encounter: Secondary | ICD-10-CM | POA: Insufficient documentation

## 2020-11-09 DIAGNOSIS — S7002XA Contusion of left hip, initial encounter: Secondary | ICD-10-CM | POA: Insufficient documentation

## 2020-11-09 DIAGNOSIS — S0990XA Unspecified injury of head, initial encounter: Secondary | ICD-10-CM | POA: Insufficient documentation

## 2020-11-09 DIAGNOSIS — Y9361 Activity, american tackle football: Secondary | ICD-10-CM | POA: Insufficient documentation

## 2020-11-09 DIAGNOSIS — F17210 Nicotine dependence, cigarettes, uncomplicated: Secondary | ICD-10-CM | POA: Insufficient documentation

## 2020-11-09 DIAGNOSIS — W19XXXA Unspecified fall, initial encounter: Secondary | ICD-10-CM

## 2020-11-09 DIAGNOSIS — W228XXA Striking against or struck by other objects, initial encounter: Secondary | ICD-10-CM | POA: Insufficient documentation

## 2020-11-09 DIAGNOSIS — S161XXA Strain of muscle, fascia and tendon at neck level, initial encounter: Secondary | ICD-10-CM | POA: Insufficient documentation

## 2020-11-09 MED ORDER — NAPROXEN 500 MG PO TABS: 500.0000 mg | ORAL_TABLET | Freq: Two times a day (BID) | ORAL | 0 refills | Status: DC

## 2020-11-09 NOTE — ED Triage Notes (Signed)
Presents with pain to left side of neck,shoulder and collarbone  States he ran into a swing set while playing f/b yesterday

## 2020-11-09 NOTE — Discharge Instructions (Addendum)
With your primary care provider if any continued problems or concerns.  You may use ice or heat to your muscles as needed for discomfort.  The abrasions on your shoulder should be cleaned twice daily with mild soap and water and watch for signs of infection such as redness or pus.  You will be sore for approximately 3 to 4 days even with taking medications.  The medication that was sent to your pharmacy is to be taken twice a day with food.  Discontinue taking it if you develop any stomach discomfort.  CT of your head was negative for any acute changes however should you develop any continued blurred vision, continued headache or not improving return to the emergency department for reevaluation.

## 2020-11-09 NOTE — ED Provider Notes (Signed)
[facility] Emergency Department Provider Note  ____________________________________________   Event Date/Time   First MD Initiated Contact with Patient 11/09/20 1319     (approximate)  I have reviewed the triage vital signs and the nursing notes.   HISTORY  Chief Complaint No chief complaint on file.   HPI Keith Rodgers is a 30 y.o. male presents to the ED with multiple injuries and complaints.  Patient states that last evening he was drinking alcohol and was playing football.  Patient states that he ran into a swing set and the metal bars hit him in the head and neck.  He states that there was questionable loss of consciousness.  He denies any nausea, vomiting or dizziness today.  He has had some visual changes in which "everything is blurry".  Patient also complains of left shoulder pain and difficulty with range of motion.  Patient has been ambulatory since this event.  Patient admits to 2 beers prior to this incident.         History reviewed. No pertinent past medical history.  There are no problems to display for this patient.   Past Surgical History:  Procedure Laterality Date  . KNEE SURGERY      Prior to Admission medications   Medication Sig Start Date End Date Taking? Authorizing Provider  naproxen (NAPROSYN) 500 MG tablet Take 1 tablet (500 mg total) by mouth 2 (two) times daily with a meal. 11/09/20  Yes Bridget HartshornSummers, Mahlet Jergens L, PA-C  EPINEPHrine (EPIPEN 2-PAK) 0.3 mg/0.3 mL IJ SOAJ injection Inject 0.3 mLs (0.3 mg total) into the muscle as needed for anaphylaxis. 11/24/19   Phineas SemenGoodman, Graydon, MD  guaiFENesin-codeine 100-10 MG/5ML syrup Take 5 mLs by mouth every 6 (six) hours as needed for cough. 10/13/16   Minna AntisPaduchowski, Kevin, MD    Allergies Patient has no known allergies.  No family history on file.  Social History Social History   Tobacco Use  . Smoking status: Current Every Day Smoker    Packs/day: 0.25    Types: Cigarettes  .  Smokeless tobacco: Current User  Substance Use Topics  . Alcohol use: Yes  . Drug use: No    Review of Systems Constitutional: No fever/chills Eyes: Positive blurry vision. ENT: No trauma. Cardiovascular: Denies chest pain. Respiratory: Denies shortness of breath.  Negative for rib pain. Gastrointestinal: No abdominal pain.  No nausea, no vomiting.  Genitourinary: Negative for dysuria. Musculoskeletal: Positive cervical pain, left shoulder pain, right hip pain. Skin: Positive for abrasions. Neurological: Negative for headaches, focal weakness or numbness. ____________________________________________   PHYSICAL EXAM:  VITAL SIGNS: ED Triage Vitals  Enc Vitals Group     BP 11/09/20 1306 116/71     Pulse Rate 11/09/20 1306 62     Resp 11/09/20 1306 18     Temp 11/09/20 1306 (!) 97.4 F (36.3 C)     Temp Source 11/09/20 1306 Oral     SpO2 11/09/20 1306 99 %     Weight 11/09/20 1307 150 lb (68 kg)     Height 11/09/20 1307 6\' 1"  (1.854 m)     Head Circumference --      Peak Flow --      Pain Score --      Pain Loc --      Pain Edu? --      Excl. in GC? --    Constitutional: Alert and oriented. Well appearing and in no acute distress. Eyes: Conjunctivae are normal. PERRL. EOMI. no  nystagmus noted. Head: Atraumatic. Nose: No congestion/rhinnorhea. Mouth/Throat: No trauma. Neck: No stridor.  Mild diffuse tenderness on palpation cervical spine posteriorly.  Range of motion is mildly decreased secondary to pain. Cardiovascular: Normal rate, regular rhythm. Grossly normal heart sounds.  Good peripheral circulation. Respiratory: Normal respiratory effort.  No retractions. Lungs CTAB.  Nontender ribs to palpation and compression.  No abrasions were noted. Gastrointestinal: Soft and nontender. No distention.  Bowel sounds normoactive x4 quadrants. Musculoskeletal: No tenderness on palpation of the thoracic or lumbar spine.  Patient does have moderate tenderness on palpation of the  left shoulder including the clavicle.  No gross deformity is noted.  There are soft tissue abrasions noted in this region but no active drainage or bleeding.  Tenderness is noted on compression of the hips but specifically on the right hip area without deformity.  Range of motion is minimally limited secondary to discomfort.  No tenderness is noted on palpation of the the thigh, knee or tib-fib area.  Good muscle strength is noted bilaterally. Neurologic:  Normal speech and language. No gross focal neurologic deficits are appreciated. No gait instability. Skin:  Skin is warm, dry and intact.  Abrasions are noted as above. Psychiatric: Mood and affect are normal. Speech and behavior are normal.  ____________________________________________   LABS (all labs ordered are listed, but only abnormal results are displayed)  Labs Reviewed - No data to display ____________________________________________  RADIOLOGY I, Tommi Rumps, personally viewed and evaluated these images (plain radiographs) as part of my medical decision making, as well as reviewing the written report by the radiologist.   Official radiology report(s): CT Head Wo Contrast  Result Date: 11/09/2020 CLINICAL DATA:  Left-sided neck, shoulder, and clavicular pain. Ran into a swing set while playing football yesterday. EXAM: CT HEAD WITHOUT CONTRAST CT CERVICAL SPINE WITHOUT CONTRAST TECHNIQUE: Multidetector CT imaging of the head and cervical spine was performed following the standard protocol without intravenous contrast. Multiplanar CT image reconstructions of the cervical spine were also generated. COMPARISON:  None. FINDINGS: CT HEAD FINDINGS Brain: There is no evidence of an acute infarct, intracranial hemorrhage, mass, midline shift, or extra-axial fluid collection. The ventricles and sulci are normal. Vascular: No hyperdense vessel. Skull: No fracture or suspicious osseous lesion. Sinuses/Orbits: Visualized paranasal sinuses and  mastoid air cells are clear. Unremarkable orbits. Other: None. CT CERVICAL SPINE FINDINGS Alignment: Normal. Skull base and vertebrae: No acute fracture or suspicious osseous lesion. Soft tissues and spinal canal: No prevertebral fluid or swelling. No visible canal hematoma. Disc levels:  Unremarkable. Upper chest: Mild scarring in the lung apices. Other: None. IMPRESSION: 1. Negative head CT. 2. No evidence of acute osseous abnormality in the cervical spine. Electronically Signed   By: Sebastian Ache M.D.   On: 11/09/2020 14:10   CT Cervical Spine Wo Contrast  Result Date: 11/09/2020 CLINICAL DATA:  Left-sided neck, shoulder, and clavicular pain. Ran into a swing set while playing football yesterday. EXAM: CT HEAD WITHOUT CONTRAST CT CERVICAL SPINE WITHOUT CONTRAST TECHNIQUE: Multidetector CT imaging of the head and cervical spine was performed following the standard protocol without intravenous contrast. Multiplanar CT image reconstructions of the cervical spine were also generated. COMPARISON:  None. FINDINGS: CT HEAD FINDINGS Brain: There is no evidence of an acute infarct, intracranial hemorrhage, mass, midline shift, or extra-axial fluid collection. The ventricles and sulci are normal. Vascular: No hyperdense vessel. Skull: No fracture or suspicious osseous lesion. Sinuses/Orbits: Visualized paranasal sinuses and mastoid air cells are clear. Unremarkable orbits.  Other: None. CT CERVICAL SPINE FINDINGS Alignment: Normal. Skull base and vertebrae: No acute fracture or suspicious osseous lesion. Soft tissues and spinal canal: No prevertebral fluid or swelling. No visible canal hematoma. Disc levels:  Unremarkable. Upper chest: Mild scarring in the lung apices. Other: None. IMPRESSION: 1. Negative head CT. 2. No evidence of acute osseous abnormality in the cervical spine. Electronically Signed   By: Sebastian Ache M.D.   On: 11/09/2020 14:10   DG Shoulder Left  Result Date: 11/09/2020 CLINICAL DATA:  Injured  shoulder yesterday. EXAM: LEFT SHOULDER - 2+ VIEW COMPARISON:  None. FINDINGS: The joint spaces are maintained. No acute bony findings or bone lesion. No abnormal soft tissue calcifications. The visualized lung is clear and the visualized ribs are intact. IMPRESSION: Normal left shoulder radiographs. Electronically Signed   By: Rudie Meyer M.D.   On: 11/09/2020 14:34   DG Hip Unilat W or Wo Pelvis 2-3 Views Right  Result Date: 11/09/2020 CLINICAL DATA:  Injured right hip playing football. EXAM: DG HIP (WITH OR WITHOUT PELVIS) 2-3V RIGHT COMPARISON:  03/11/2009 FINDINGS: Both hips are normally located. No fracture or AVN. The pubic symphysis and SI joints are intact. No pelvic fractures or bone lesions. IMPRESSION: No acute bony findings. Electronically Signed   By: Rudie Meyer M.D.   On: 11/09/2020 14:36    ____________________________________________   PROCEDURES  Procedure(s) performed (including Critical Care):  Procedures   ____________________________________________   INITIAL IMPRESSION / ASSESSMENT AND PLAN / ED COURSE  As part of my medical decision making, I reviewed the following data within the electronic MEDICAL RECORD NUMBER Notes from prior ED visits and [place] Controlled Substance Database  30 year old male presents to the ED with complaint multiple complaints after he ran into a swing set last evening.  Patient admits to 2 beers prior to this and complains of blurred vision, cervical and left shoulder pain.  Patient does have abrasions to his left shoulder but no gross deformity and x-rays of his left shoulder, left hip, CT cervical spine and head CT are all reassuring.  Patient was offered a Toradol injection which he declined.  A prescription for naproxen 500 mg twice daily was sent to his pharmacy.  Patient is aware that he needs to eat with this medication discontinue if he develops any stomach upset.  He is to follow-up with his PCP if any continued  problems. ____________________________________________   FINAL CLINICAL IMPRESSION(S) / ED DIAGNOSES  Final diagnoses:  Contusion of multiple sites of left shoulder, initial encounter  Acute strain of neck muscle, initial encounter  Contusion of left hip, initial encounter  Mild closed head injury, initial encounter  Fall, initial encounter     ED Discharge Orders         Ordered    naproxen (NAPROSYN) 500 MG tablet  2 times daily with meals        11/09/20 1448          *Please note:  Keith Rodgers was evaluated in Emergency Department on 11/09/2020 for the symptoms described in the history of present illness. He was evaluated in the context of the global COVID-19 pandemic, which necessitated consideration that the patient might be at risk for infection with the SARS-CoV-2 virus that causes COVID-19. Institutional protocols and algorithms that pertain to the evaluation of patients at risk for COVID-19 are in a state of rapid change based on information released by regulatory bodies including the CDC and federal and state organizations. These policies  and algorithms were followed during the patient's care in the ED.  Some ED evaluations and interventions may be delayed as a result of limited staffing during and the pandemic.*   Note:  This document was prepared using Dragon voice recognition software and may include unintentional dictation errors.    Tommi Rumps, PA-C 11/09/20 1455    Delton Prairie, MD 11/09/20 1538

## 2021-05-11 ENCOUNTER — Emergency Department: Payer: No Typology Code available for payment source

## 2021-05-11 ENCOUNTER — Other Ambulatory Visit: Payer: Self-pay

## 2021-05-11 ENCOUNTER — Emergency Department
Admission: EM | Admit: 2021-05-11 | Discharge: 2021-05-11 | Disposition: A | Discharge status: Home / Self Care | Payer: No Typology Code available for payment source | Attending: Emergency Medicine | Admitting: Emergency Medicine

## 2021-05-11 DIAGNOSIS — M545 Low back pain, unspecified: Secondary | ICD-10-CM | POA: Diagnosis not present

## 2021-05-11 DIAGNOSIS — X500XXA Overexertion from strenuous movement or load, initial encounter: Secondary | ICD-10-CM | POA: Diagnosis not present

## 2021-05-11 DIAGNOSIS — M25511 Pain in right shoulder: Secondary | ICD-10-CM | POA: Insufficient documentation

## 2021-05-11 DIAGNOSIS — Y990 Civilian activity done for income or pay: Secondary | ICD-10-CM | POA: Diagnosis not present

## 2021-05-11 DIAGNOSIS — F17210 Nicotine dependence, cigarettes, uncomplicated: Secondary | ICD-10-CM | POA: Diagnosis not present

## 2021-05-11 MED ORDER — MELOXICAM 15 MG PO TABS: 15.0000 mg | ORAL_TABLET | Freq: Every day | ORAL | 2 refills | Status: AC

## 2021-05-11 MED ORDER — METHOCARBAMOL 500 MG PO TABS: 500.0000 mg | ORAL_TABLET | Freq: Three times a day (TID) | ORAL | 0 refills | Status: AC | PRN

## 2021-05-11 NOTE — ED Provider Notes (Signed)
[facility]-EMERGENCY DEPARTMENT  ____________________________________________  Time seen: Approximately 7:08 PM  I have reviewed the triage vital signs and the nursing notes.   HISTORY  Chief Complaint Shoulder Injury   Historian Patient     HPI Keith Rodgers is a 30 y.o. male presents to the emergency department with right shoulder pain and low back pain after patient was pushing heavy boxes at work.  Patient states that he has some pain that radiates along the lateral aspect of his right lower extremity.  No bowel or bladder incontinence or saddle anesthesia.  Patient has been able to ambulate easily at home.   History reviewed. No pertinent past medical history.   Immunizations up to date:  Yes.     History reviewed. No pertinent past medical history.  There are no problems to display for this patient.   Past Surgical History:  Procedure Laterality Date   KNEE SURGERY      Prior to Admission medications   Medication Sig Start Date End Date Taking? Authorizing Provider  meloxicam (MOBIC) 15 MG tablet Take 1 tablet (15 mg total) by mouth daily. 05/11/21 05/11/22 Yes Pia Mau M, PA-C  methocarbamol (ROBAXIN) 500 MG tablet Take 1 tablet (500 mg total) by mouth every 8 (eight) hours as needed for up to 5 days. 05/11/21 05/16/21 Yes Pia Mau M, PA-C  EPINEPHrine (EPIPEN 2-PAK) 0.3 mg/0.3 mL IJ SOAJ injection Inject 0.3 mLs (0.3 mg total) into the muscle as needed for anaphylaxis. 11/24/19   Phineas Semen, MD    Allergies Patient has no known allergies.  No family history on file.  Social History Social History   Tobacco Use   Smoking status: Every Day    Packs/day: 0.25    Types: Cigarettes   Smokeless tobacco: Current  Substance Use Topics   Alcohol use: Yes   Drug use: No     Review of Systems  Constitutional: No fever/chills Eyes:  No discharge ENT: No upper respiratory complaints. Respiratory: no cough. No SOB/ use of accessory muscles  to breath Gastrointestinal:   No nausea, no vomiting.  No diarrhea.  No constipation. Musculoskeletal: Patient has right shoulder pain and low back pain.  Skin: Negative for rash, abrasions, lacerations, ecchymosis.    ____________________________________________   PHYSICAL EXAM:  VITAL SIGNS: ED Triage Vitals  Enc Vitals Group     BP 05/11/21 1655 (!) 125/91     Pulse Rate 05/11/21 1655 60     Resp 05/11/21 1655 18     Temp 05/11/21 1655 97.8 F (36.6 C)     Temp Source 05/11/21 1655 Oral     SpO2 05/11/21 1655 100 %     Weight 05/11/21 1656 130 lb (59 kg)     Height 05/11/21 1656 6\' 1"  (1.854 m)     Head Circumference --      Peak Flow --      Pain Score 05/11/21 1656 6     Pain Loc --      Pain Edu? --      Excl. in GC? --      Constitutional: Alert and oriented. Well appearing and in no acute distress. Eyes: Conjunctivae are normal. PERRL. EOMI. Head: Atraumatic. ENT: Cardiovascular: Normal rate, regular rhythm. Normal S1 and S2.  Good peripheral circulation. Respiratory: Normal respiratory effort without tachypnea or retractions. Lungs CTAB. Good air entry to the bases with no decreased or absent breath sounds Gastrointestinal: Bowel sounds x 4 quadrants. Soft and nontender to palpation. No guarding  or rigidity. No distention. Musculoskeletal: Full range of motion to all extremities. No obvious deformities noted Neurologic:  Normal for age. No gross focal neurologic deficits are appreciated.  Skin:  Skin is warm, dry and intact. No rash noted. Psychiatric: Mood and affect are normal for age. Speech and behavior are normal.   ____________________________________________   LABS (all labs ordered are listed, but only abnormal results are displayed)  Labs Reviewed - No data to display ____________________________________________  EKG   ____________________________________________  RADIOLOGY Geraldo Pitter, personally viewed and evaluated these images  (plain radiographs) as part of my medical decision making, as well as reviewing the written report by the radiologist.  DG Lumbar Spine 2-3 Views  Result Date: 05/11/2021 CLINICAL DATA:  Low back pain.  Work place injury EXAM: LUMBAR SPINE - 2-3 VIEW COMPARISON:  None. FINDINGS: There is no evidence of lumbar spine fracture. Alignment is normal. Intervertebral disc spaces are maintained. IMPRESSION: Negative. Electronically Signed   By: Charlett Nose M.D.   On: 05/11/2021 18:40   DG Shoulder Right  Result Date: 05/11/2021 CLINICAL DATA:  Right shoulder injury.  work place injury EXAM: RIGHT SHOULDER - 2+ VIEW COMPARISON:  None. FINDINGS: There is no evidence of fracture or dislocation. There is no evidence of arthropathy or other focal bone abnormality. Soft tissues are unremarkable. IMPRESSION: Negative. Electronically Signed   By: Charlett Nose M.D.   On: 05/11/2021 18:40    ____________________________________________    PROCEDURES  Procedure(s) performed:     Procedures     Medications - No data to display   ____________________________________________   INITIAL IMPRESSION / ASSESSMENT AND PLAN / ED COURSE  Pertinent labs & imaging results that were available during my care of the patient were reviewed by me and considered in my medical decision making (see chart for details).      Assessment and plan Shoulder pain Low back pain 30 year old male presents to the emergency department with right shoulder pain and low back pain.  Vital signs were reassuring at triage.  On physical exam, patient was alert, active and nontoxic.Marland Kitchen  Dedicated x-rays of the right shoulder and lumbar spine showed no acute bony abnormality.  Patient was discharged with meloxicam and Robaxin.  He was advised to follow-up with orthopedics as needed.     ____________________________________________  FINAL CLINICAL IMPRESSION(S) / ED DIAGNOSES  Final diagnoses:  Acute pain of right shoulder       NEW MEDICATIONS STARTED DURING THIS VISIT:  ED Discharge Orders          Ordered    meloxicam (MOBIC) 15 MG tablet  Daily        05/11/21 1906    methocarbamol (ROBAXIN) 500 MG tablet  Every 8 hours PRN        05/11/21 1906                This chart was dictated using voice recognition software/Dragon. Despite best efforts to proofread, errors can occur which can change the meaning. Any change was purely unintentional.     Orvil Feil, PA-C 05/11/21 1912    Minna Antis, MD 05/11/21 [phone]

## 2021-05-11 NOTE — ED Notes (Addendum)
See triage note  presents with pain to right side of neck and upper back  states he had a box fall unto his head  and then was pushed against another box

## 2021-05-11 NOTE — Discharge Instructions (Signed)
Take Meloxicam and Robaxin as directed.  

## 2021-05-11 NOTE — ED Triage Notes (Signed)
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 Also c/o pain to lower back radiating to right lower leg.  Workers comp Research scientist (physical sciences) in [employer], states already did a drug test at [employer].  MSE Jenise PA in triage

## 2022-03-04 ENCOUNTER — Emergency Department
Admission: EM | Admit: 2022-03-04 | Discharge: 2022-03-04 | Disposition: A | Discharge status: Home / Self Care | Payer: Medicaid Other | Attending: Emergency Medicine | Admitting: Emergency Medicine

## 2022-03-04 ENCOUNTER — Encounter: Payer: Self-pay | Admitting: Emergency Medicine

## 2022-03-04 DIAGNOSIS — K0401 Reversible pulpitis: Secondary | ICD-10-CM | POA: Insufficient documentation

## 2022-03-04 DIAGNOSIS — K0510 Chronic gingivitis, plaque induced: Secondary | ICD-10-CM

## 2022-03-04 DIAGNOSIS — I10 Essential (primary) hypertension: Secondary | ICD-10-CM | POA: Insufficient documentation

## 2022-03-04 DIAGNOSIS — K0500 Acute gingivitis, plaque induced: Secondary | ICD-10-CM | POA: Insufficient documentation

## 2022-03-04 DIAGNOSIS — K0889 Other specified disorders of teeth and supporting structures: Secondary | ICD-10-CM

## 2022-03-04 MED ORDER — CHLORHEXIDINE GLUCONATE 0.12 % MT SOLN: 15.0000 mL | Freq: Two times a day (BID) | OROMUCOSAL | 0 refills | Status: AC

## 2022-03-04 MED ORDER — PENICILLIN V POTASSIUM 500 MG PO TABS: 500.0000 mg | ORAL_TABLET | Freq: Four times a day (QID) | ORAL | 0 refills | Status: AC

## 2022-03-04 NOTE — ED Triage Notes (Signed)
Pt presents via POV with complaints of left upper dental pain that started last night. He notes having pain when chewing - unsure if he has dental caries.  Denies Fevers, N/V.

## 2022-03-04 NOTE — ED Notes (Signed)
E-signature not working at this time. Pt verbalized understanding of D/C instructions, prescriptions and follow up care with no further questions at this time. Pt in NAD and ambulatory at time of D/C.  

## 2022-03-04 NOTE — Discharge Instructions (Addendum)
OPTIONS FOR DENTAL FOLLOW UP CARE ° °[place] Department of Health and Human Services - Local Safety Net Dental Clinics °http://www.ncdhhs.gov/dph/oralhealth/services/safetynetclinics.htm °  °[facility] (336-562-3123) ° °[facility] [place] ([phone]) ° °[facility] ([phone] ext [phone]) ° °[facility] ([phone]) ° °[facility] ([phone]) °This clinic caters to the indigent population and is on a lottery system. °Location: °[facility], [address], [place] °Clinic Hours: °Wednesdays from 6pm - 9pm, patients seen by a lottery system. °For dates, call or go to www.med.[facility].edu/[facility]/patients/Dental-[facility] °Services: °Cleanings, fillings and simple extractions. °Payment Options: °DENTAL WORK IS FREE OF CHARGE. Bring proof of income or support. °Best way to get seen: °Arrive at 5:15 pm - this is a lottery, NOT first come/first serve, so arriving earlier will not increase your chances of being seen. °  °  °[facility] °[phone] °Select option 1 for emergencies °  °Location: °[facility], [address], [place] °Clinic Hours: °No walk-ins accepted - call the day before to schedule an appointment. °Check in times are 9:30 am and 1:30 pm. °Services: °Simple extractions, temporary fillings, pulpectomy/pulp debridement, uncomplicated abscess drainage. °Payment Options: °PAYMENT IS DUE AT THE TIME OF SERVICE.  Fee is usually $100-200, additional surgical procedures (e.g. abscess drainage) may be extra. °Cash, checks, Visa/MasterCard accepted.  Can file Medicaid if patient is covered for dental - patient should call case worker to check. °No discount for [facility] patients. °Best way to get seen: °MUST call the day before and get onto the schedule. Can usually be seen the next 1-2 days. No walk-ins accepted. °  °  °[facility] °[phone] °   °Location: °[facility], [address], [place] °Clinic Hours: °M, W, Th, F 8am or 1:30pm, Tues 9a or 1:30 - first come/first served. °Services: °Simple extractions, temporary fillings, uncomplicated abscess drainage.  You do not need to be an [place] resident. °Payment Options: °PAYMENT IS DUE AT THE TIME OF SERVICE. °Dental insurance, otherwise sliding scale - bring proof of income or support. °Depending on income and treatment needed, cost is usually $50-200. °Best way to get seen: °Arrive early as it is first come/first served. °  °  °[facility] °[phone] °  °Location: °[address] °Clinic Hours: °Mon-Thu 8a-5p °Services: °Most basic dental services including extractions and fillings. °Payment Options: °PAYMENT IS DUE AT THE TIME OF SERVICE. °Sliding scale, up to 50% off - bring proof if income or support. °Medicaid with dental option accepted. °Best way to get seen: °Call to schedule an appointment, can usually be seen within 2 weeks OR they will try to see walk-ins - show up at 8a or 2p (you may have to wait). °  °  °[facility] °[phone] °[place] RESIDENTS ONLY °  °Location: °[facility], [address], [place], [place] [address] °Clinic Hours: By appointment only. °Monday - Thursday 8am-5pm, Friday 8am-12pm °Services: Cleanings, fillings, extractions. °Payment Options: °PAYMENT IS DUE AT THE TIME OF SERVICE. °Cash, Visa or MasterCard. Sliding scale - $30 minimum per service. °Best way to get seen: °Come in to office, complete packet and make an appointment - need proof of income °or support monies for each household member and proof of [place] residence. °Usually takes about a month to get in. °  °  °[facility] °[phone] °  °Location: °[address]   [place] °Clinic Hours: Walk-in Urgent Care Dental Services are offered Monday-Friday  mornings only. °The numbers of emergencies accepted daily is limited to the number of °providers available. °Maximum 15 - Mondays, Wednesdays & Thursdays °Maximum 10 - Tuesdays & Fridays °Services: °You do not need to be a [place] resident to be seen for a dental emergency. °Emergencies are defined as pain, swelling, abnormal bleeding, or dental trauma. Walkins will receive x-rays if needed. °NOTE: Dental cleaning is not an emergency. °Payment Options: °PAYMENT IS DUE AT THE TIME OF SERVICE. °Minimum co-pay is $40.00 for uninsured patients. °Minimum co-pay is $3.00 for Medicaid with dental coverage. °Dental Insurance is accepted and must be presented at time of visit. °Medicare does not cover dental. °Forms of payment: Cash, credit card, checks. °Best way to get seen: °If not previously registered with the clinic, walk-in dental registration begins at 7:15 am and is on a first come/first serve basis. °If previously registered with the clinic, call to make an appointment. °  °  °[facility] °[phone] °[place] RESIDENTS ONLY °  °Location: °[address], [place], [place] °Clinic Hours: °Mon-Thu 10a-2p °Services: Extractions only! °Payment Options: °FREE (donations accepted) - bring proof of income or support °Best way to get seen: °Call and schedule an appointment OR come at 8am on the 1st Monday of every month (except for holidays) when it is first come/first served. °  °  °[facility] °[phone] °  °Location: °[address], [place] °Clinic Hours: °Friday mornings °Services, Payment Options, Best way to get seen: °Call for info °

## 2022-03-04 NOTE — ED Provider Notes (Signed)
[facility] Provider Note    Event Date/Time   First MD Initiated Contact with Patient 03/04/22 [phone]     (approximate)   History   Dental Pain   HPI  Keith Rodgers is a 31 y.o. male without significant past medical history presents for evaluation of approximate 24 hours of nontraumatic left posterior dental pain.  No fevers, vomiting, earache, sore throat, difficulty swallowing or other acute sick symptoms.  He has been taking naproxen ibuprofen without much relief.  He has not seen a dentist for this.  He has no other acute complaints.    History reviewed. No pertinent past medical history.   Physical Exam  Triage Vital Signs: ED Triage Vitals  Enc Vitals Group     BP 03/04/22 0401 (!) 149/97     Pulse Rate 03/04/22 0401 63     Resp 03/04/22 0401 18     Temp 03/04/22 0401 98.8 F (37.1 C)     Temp Source 03/04/22 0401 Oral     SpO2 03/04/22 0401 98 %     Weight 03/04/22 0401 135 lb (61.2 kg)     Height 03/04/22 0401 6\' 1"  (1.854 m)     Head Circumference --      Peak Flow --      Pain Score --      Pain Loc --      Pain Edu? --      Excl. in GC? --     Most recent vital signs: Vitals:   03/04/22 0401  BP: (!) 149/97  Pulse: 63  Resp: 18  Temp: 98.8 F (37.1 C)  SpO2: 98%    General: Awake, no distress.  CV:  Good peripheral perfusion.  Resp:  Normal effort.  Abd:  No distention.  Other:  Left posterior molar is very eroded and on the buccal lingual aspect of the superior left gingiva there is a fair amount of edema little bit of swelling.  There is no focal areas of fluctuance or palpable abscesses.  There is no induration of the tongue.  Remainder of oropharynx is unremarkable.  Cranial nerves are unremarkable.  No bleeding or purulent drainage noted.   ED Results / Procedures / Treatments  Labs (all labs ordered are listed, but only abnormal results are displayed) Labs Reviewed - No data to  display   EKG    RADIOLOGY    PROCEDURES:  Critical Care performed: No  Procedures    MEDICATIONS ORDERED IN ED: Medications - No data to display   IMPRESSION / MDM / ASSESSMENT AND PLAN / ED COURSE  I reviewed the triage vital signs and the nursing notes. Patient's presentation is most consistent with acute, uncomplicated illness.                               Differential diagnosis includes, but is not limited to colitis, pulpitis with overall lower suspicion based on his history and exam for necrotizing gingivitis or Ludwig's angina sepsis or other deep space infection.  He is appropriate for outpatient dental follow-up.  Rx for penicillin and concern for pulpitis.  Will also given Rx for Peridex.  Advised to have blood pressure rechecked at PCPs office and 7 to 10 days.  Discharged in stable condition.       FINAL CLINICAL IMPRESSION(S) / ED DIAGNOSES   Final diagnoses:  Pain, dental  Gingivitis  Pulpitis  Hypertension, unspecified  type     Rx / DC Orders   ED Discharge Orders          Ordered    penicillin v potassium (VEETID) 500 MG tablet  4 times daily        03/04/22 0544    chlorhexidine (PERIDEX) 0.12 % solution  2 times daily        03/04/22 0546             Note:  This document was prepared using Dragon voice recognition software and may include unintentional dictation errors.   Gilles Chiquito, MD 03/04/22 [phone]

## 2024-06-29 ENCOUNTER — Emergency Department
Admission: EM | Admit: 2024-06-29 | Discharge: 2024-06-29 | Disposition: A | Discharge status: Home / Self Care | Payer: Self-pay | Attending: Emergency Medicine | Admitting: Emergency Medicine

## 2024-06-29 ENCOUNTER — Other Ambulatory Visit: Payer: Self-pay

## 2024-06-29 DIAGNOSIS — L02212 Cutaneous abscess of back [any part, except buttock]: Secondary | ICD-10-CM | POA: Insufficient documentation

## 2024-06-29 DIAGNOSIS — L0291 Cutaneous abscess, unspecified: Secondary | ICD-10-CM

## 2024-06-29 DIAGNOSIS — L03319 Cellulitis of trunk, unspecified: Secondary | ICD-10-CM | POA: Insufficient documentation

## 2024-06-29 DIAGNOSIS — L03312 Cellulitis of back [any part except buttock]: Secondary | ICD-10-CM

## 2024-06-29 HISTORY — DX: Dermatographic urticaria: L50.3

## 2024-06-29 MED ORDER — LIDOCAINE HCL (PF) 1 % IJ SOLN
5.0000 mL | Freq: Once | INTRAMUSCULAR | Status: AC
  Administered 2024-06-29: 5 mL
  Filled 2024-06-29: qty 5

## 2024-06-29 MED ORDER — CEPHALEXIN 500 MG PO CAPS: 500.0000 mg | ORAL_CAPSULE | Freq: Four times a day (QID) | ORAL | 0 refills | Status: AC

## 2024-06-29 MED ORDER — DOXYCYCLINE MONOHYDRATE 100 MG PO TABS: 100.0000 mg | ORAL_TABLET | Freq: Two times a day (BID) | ORAL | 0 refills | Status: AC

## 2024-06-29 NOTE — ED Provider Notes (Signed)
 [facility] Provider Note    Event Date/Time   First MD Initiated Contact with Patient 06/29/24 1227     (approximate)   History   Abscess   HPI  Keith Rodgers is a 33 y.o. male who presents today for evaluation of abscess that began 2 to 3 weeks ago.  Patient reports that the area is quite tender.  No fevers or chills.  There are no active problems to display for this patient.         Physical Exam   Triage Vital Signs: ED Triage Vitals  Encounter Vitals Group     BP 06/29/24 1204 123/83     Girls Systolic BP Percentile --      Girls Diastolic BP Percentile --      Boys Systolic BP Percentile --      Boys Diastolic BP Percentile --      Pulse Rate 06/29/24 1204 73     Resp 06/29/24 1204 18     Temp 06/29/24 1205 98.3 F (36.8 C)     Temp Source 06/29/24 1205 Oral     SpO2 06/29/24 1204 99 %     Weight 06/29/24 1205 140 lb (63.5 kg)     Height 06/29/24 1205 6' 1 (1.854 m)     Head Circumference --      Peak Flow --      Pain Score 06/29/24 1204 3     Pain Loc --      Pain Education --      Exclude from Growth Chart --     Most recent vital signs: Vitals:   06/29/24 1204 06/29/24 1205  BP: 123/83   Pulse: 73   Resp: 18   Temp:  98.3 F (36.8 C)  SpO2: 99%     Physical Exam Vitals and nursing note reviewed.  Constitutional:      General: Awake and alert. No acute distress.    Appearance: Normal appearance. The patient is normal weight.  HENT:     Head: Normocephalic and atraumatic.     Mouth: Mucous membranes are moist.  Eyes:     General: PERRL. Normal EOMs        Right eye: No discharge.        Left eye: No discharge.     Conjunctiva/sclera: Conjunctivae normal.  Cardiovascular:     Rate and Rhythm: Normal rate.     Pulses: Normal pulses.  Pulmonary:     Effort: Pulmonary effort is normal. No respiratory distress.     Breath sounds: Normal breath sounds.  Abdominal:     Abdomen is soft. There is no  abdominal tenderness. No rebound or guarding. No distention. Musculoskeletal:        General: No swelling. Normal range of motion.     Cervical back: Normal range of motion and neck supple.  Skin:    General: Skin is warm and dry.     Capillary Refill: Capillary refill takes less than 2 seconds.     Findings: 2 x 2 cm area of fluctuance to left side of back.  No surrounding erythema.  Tender to palpation.  No open wound.  No drainage. Neurological:     Mental Status: The patient is awake and alert.      ED Results / Procedures / Treatments   Labs (all labs ordered are listed, but only abnormal results are displayed) Labs Reviewed - No data to display   EKG  RADIOLOGY     PROCEDURES:  Critical Care performed:   .Incision and Drainage  Date/Time: 06/29/2024 1:29 PM  Performed by: Patrycja Mumpower E, PA-C Authorized by: Tamatha Gadbois E, PA-C   Consent:    Consent obtained:  Verbal   Consent given by:  Patient   Risks, benefits, and alternatives were discussed: yes     Risks discussed:  Bleeding, incomplete drainage, pain and damage to other organs   Alternatives discussed:  No treatment Universal protocol:    Procedure explained and questions answered to patient or proxy's satisfaction: yes     Relevant documents present and verified: yes     Test results available : yes     Required blood products, implants, devices, and special equipment available: yes     Site/side marked: yes     Immediately prior to procedure, a time out was called: yes     Patient identity confirmed:  Verbally with patient Location:    Type:  Abscess   Size:  2x2cm   Location:  Trunk   Trunk location:  Back Pre-procedure details:    Skin preparation:  Antiseptic wash Anesthesia:    Anesthesia method:  Local infiltration   Local anesthetic:  Lidocaine  1% w/o epi Procedure type:    Complexity:  Simple Procedure details:    Incision types:  Single straight   Incision depth:  Dermal    Wound management:  Probed and deloculated, irrigated with saline and extensive cleaning   Drainage:  Purulent and bloody   Drainage amount:  Scant   Wound treatment:  Wound left open   Packing materials:  None Post-procedure details:    Procedure completion:  Tolerated well, no immediate complications    MEDICATIONS ORDERED IN ED: Medications  lidocaine  (PF) (XYLOCAINE ) 1 % injection 5 mL (5 mLs Infiltration Given 06/29/24 1337)     IMPRESSION / MDM / ASSESSMENT AND PLAN / ED COURSE  I reviewed the triage vital signs and the nursing notes.   Differential diagnosis includes, but is not limited to, abscess, cyst, folliculitis, cellulitis.  Patient is awake and alert, hemodynamically stable and afebrile.  He is nontoxic in appearance.  The patient agreed to incision and drainage.  I&D performed with primarily bloody output, with mild purulence.  Area was flushed.  No packing applied given superficial nature.  He started on antibiotics given the overlying erythema.  We discussed wound care and return precautions.  Patient understands agrees with plan.  Discharged in stable condition.   Patient's presentation is most consistent with acute, uncomplicated illness.      FINAL CLINICAL IMPRESSION(S) / ED DIAGNOSES   Final diagnoses:  Abscess  Cellulitis of back except buttock     Rx / DC Orders   ED Discharge Orders          Ordered    cephALEXin  (KEFLEX ) 500 MG capsule  4 times daily        06/29/24 1327    doxycycline  (ADOXA) 100 MG tablet  2 times daily        06/29/24 1327             Note:  This document was prepared using Dragon voice recognition software and may include unintentional dictation errors.   Millette Halberstam E, PA-C 06/29/24 1344    Willo Dunnings, MD 06/29/24 1426

## 2024-06-29 NOTE — Discharge Instructions (Signed)
 Take the antibiotics as prescribed.  Keep the area clean and dry, wash with soap and water.  Please return for any new, worsening, or changing symptoms or other concerns.  It was a pleasure caring for you today.

## 2024-06-29 NOTE — ED Notes (Signed)
 See triage note  Presents with possible abscess to mid back  States he noted this area about 2 weeks ago

## 2024-06-29 NOTE — ED Triage Notes (Signed)
 Pt c/o abscess on back that started 2-3 weeks ago and has gotten worse. Pt denies fevers, discharge. Red abscess noted on right mid back, skin intact.
